# Patient Record
Sex: Female | Born: 1935
Health system: Southern US, Community
[De-identification: ages and names within clinical notes are randomized; demographics above are authoritative.]

## PROBLEM LIST (undated history)

## (undated) DIAGNOSIS — E039 Hypothyroidism, unspecified: Secondary | ICD-10-CM

## (undated) DIAGNOSIS — I10 Essential (primary) hypertension: Secondary | ICD-10-CM

## (undated) DIAGNOSIS — E785 Hyperlipidemia, unspecified: Secondary | ICD-10-CM

## (undated) HISTORY — DX: Essential (primary) hypertension: I10

## (undated) HISTORY — PX: APPENDECTOMY: SHX54

## (undated) HISTORY — PX: PACEMAKER INSERTION: SHX728

## (undated) HISTORY — PX: TOTAL HIP ARTHROPLASTY: SHX124

## (undated) HISTORY — PX: CATARACT EXTRACTION, BILATERAL: SHX1313

## (undated) HISTORY — DX: Hyperlipidemia, unspecified: E78.5

---

## 2000-12-14 HISTORY — PX: BREAST BIOPSY: SHX20

## 2001-06-10 ENCOUNTER — Other Ambulatory Visit: Admission: RE | Admit: 2001-06-10 | Discharge: 2001-06-10 | Payer: Self-pay | Admitting: Family Medicine

## 2001-06-13 LAB — HM PAP SMEAR: HM PAP: NORMAL

## 2001-10-19 LAB — HM COLONOSCOPY

## 2005-06-11 LAB — HM DEXA SCAN

## 2005-09-09 ENCOUNTER — Ambulatory Visit: Payer: Self-pay | Admitting: Family Medicine

## 2006-09-23 ENCOUNTER — Ambulatory Visit: Payer: Self-pay | Admitting: Family Medicine

## 2006-10-14 ENCOUNTER — Ambulatory Visit: Payer: Self-pay | Admitting: Family Medicine

## 2007-09-28 ENCOUNTER — Ambulatory Visit: Payer: Self-pay | Admitting: Family Medicine

## 2007-12-26 ENCOUNTER — Inpatient Hospital Stay (HOSPITAL_COMMUNITY): Admission: RE | Admit: 2007-12-26 | Discharge: 2007-12-29 | Payer: Self-pay | Admitting: Orthopedic Surgery

## 2007-12-29 ENCOUNTER — Encounter: Payer: Self-pay | Admitting: Internal Medicine

## 2008-01-24 ENCOUNTER — Encounter: Payer: Self-pay | Admitting: Orthopedic Surgery

## 2008-02-12 ENCOUNTER — Encounter: Payer: Self-pay | Admitting: Orthopedic Surgery

## 2008-08-27 ENCOUNTER — Inpatient Hospital Stay (HOSPITAL_COMMUNITY): Admission: RE | Admit: 2008-08-27 | Discharge: 2008-08-31 | Payer: Self-pay | Admitting: Orthopedic Surgery

## 2008-08-31 ENCOUNTER — Encounter: Payer: Self-pay | Admitting: Internal Medicine

## 2008-10-02 ENCOUNTER — Ambulatory Visit: Payer: Self-pay | Admitting: Family Medicine

## 2008-10-15 ENCOUNTER — Encounter: Payer: Self-pay | Admitting: Orthopedic Surgery

## 2008-11-13 ENCOUNTER — Encounter: Payer: Self-pay | Admitting: Orthopedic Surgery

## 2009-10-09 ENCOUNTER — Ambulatory Visit: Payer: Self-pay | Admitting: Family Medicine

## 2010-10-13 ENCOUNTER — Ambulatory Visit: Payer: Self-pay | Admitting: Family Medicine

## 2011-04-28 NOTE — Op Note (Signed)
NAME:  Savannah Tucker, Savannah Tucker NO.:  [id number]   MEDICAL RECORD NO.:  [id number]          PATIENT TYPE:  INP   LOCATION:  5024                         FACILITY:  [facility]   PHYSICIAN:  Mila Homer. Sherlean Foot, M.D. DATE OF BIRTH:  06-02-1930   DATE OF PROCEDURE:  08/27/2008  DATE OF DISCHARGE:                               OPERATIVE REPORT   SURGEON:  Mila Homer. Sherlean Foot, MD   ASSISTANT:  1. Altamese Cabal, PA-C.  2. Laural Benes. Su Hilt, PA-C.   INDICATIONS FOR PROCEDURE:  The patient failed conservative measures and  consented to right total hip replacement after successful left total hip  replacement previously.  Informed consent was obtained.   DESCRIPTION OF PROCEDURE:  The patient was laid supine, administered  spinal anesthesia, and placed in the left down lateral decubitus  position.  Sterile prep and drape was performed.  The patient was  converted to general anesthesia since the skin incision actually caused  pain.  I then used cautery to obtain hemostasis and to dissect down to  and through the fascia lata.  I then put the trial retractor in place.  I incised the anterior one-third of the gluteus medius, minimus,  lateralis in a single sleeve, dissected off the anterior femur.  I tied  with 3 stay sutures of #2 Tevdek.  I then put a Homan retractor  protecting the anterior medius, minimus, lateralis sleeve and did an  anterior hip capsulectomy.  I then used a neck cutting device to mark  out femoral neck cut and use the reciprocating saw to make that cut.  I  then placed Hohmann retractor anteriorly and posteriorly and removed the  labrum circumferentially.  I switched that side to the table with my PAs  and then sequentially reamed up to 52 mm and put in a no fiber mesh cup  with no holes and no spikes into her native anteversion of her socket.  I then went back to the back side of the table in flexed leg into a  sterile pouch anteriorly with the knee at 90 degrees.  I used  a canal  finders followed by a side-biting reamer, followed by sequential reamers  bring out a size 12.  We had good cortical chatter.  I broached to size  12, could not get the 12 seated all the way, but felt the leg length was  excellent.  I trialed with -3 head, felt this was very, very adequate  and very stable.  I removed trial components, tamped down the fully  porous-coated stem and tapped all -3 ball onto that.  Relocated the hip,  took it through an aggressive range of motion and I was very pleased  with that.  I then irrigated and put the stay sutures on the medius  lateralis sleeve from the gluteus lateralis sleeve through drill holes  in the trochanter, oversewn with #2 Tevdek sutures.  I then closed the  fascia lata with running #1 Vicryl sutures.  I then closed the deep soft  tissues with buried 0 Vicryls.  I closed the subcuticular layer with a  running  2-0 Vicryl.  I closed the skin with skin staples.  Dressed with  Mepilex dressing.   TOURNIQUET TIME:  None.   COMPLICATIONS:  None.   DRAINS:  None.   ESTIMATED BLOOD LOSS:  300 mL.           ______________________________  Mila Homer. Sherlean Foot, M.D.     SDL/MEDQ  D:  08/27/2008  T:  08/28/2008  Job:  [id number]

## 2011-04-28 NOTE — Discharge Summary (Signed)
NAME:  Savannah Tucker, Savannah Tucker NO.:  [id number]   MEDICAL RECORD NO.:  [id number]          PATIENT TYPE:  INP   LOCATION:  5024                         FACILITY:  [facility]   PHYSICIAN:  Savannah Tucker, M.D. DATE OF BIRTH:  [birth year]   DATE OF ADMISSION:  08/27/2008  DATE OF DISCHARGE:  08/31/2008                               DISCHARGE SUMMARY   ADMISSION DIAGNOSES:  1. End-stage osteoarthritis in the right hip.  2. History of atrial fibrillation.  3. Hypothyroidism.  4. Mitral tricuspid disease.   DISCHARGE DIAGNOSES:  1. Status post right total hip arthroscopy.  2. End-stage osteoarthritis in the right hip.  3. Hypotension.  4. Hypokalemia.  5. Acute blood loss anemia secondary to hernia.   HISTORY OF PRESENT ILLNESS:  Patient is a 77 year old female with  complaints of pain in her right hip for several years.  She has a past  medical history significant for a left total hip arthroplasty.  Patient  is getting worse, and the pain is starting to interfere with activities  of daily living.  Patient complains of increased pain with stairs.  She  has failed conservative treatment, including injections and NSAIDs and  is interested in hip replacement.  Risks and benefits of surgery were  discussed with the patient prior to surgery.   X-rays taken show severe arthritis in the right hip.   ALLERGIES:  Patient has no known drug allergies.   MEDICATIONS PRE-SURGERY:  1. Amoxicillin 500 mg p.r.n. before invasive procedures.  2. Advil p.r.n.  3. Lumigan 0.03% once daily.  4. Posture-D Calcium 600 mg twice daily.  5. Cardizem LA 180 mg once daily.  6. Furosemide 200 mg once daily p.r.n.  7. ABC Plus Senior Vitamins daily.  8. Metoprolol 50 mg once daily.  9. Levoxyl 25 mcg once daily.  10.Aspirin 81 mg daily.   SURGICAL PROCEDURE:  The patient was taken to the operating room on  August 27, 2008 by Dr. Georgena Spurling, associated by Skip Mayer, PA-  C and Altamese Cabal, PA-C.  The patient underwent anesthesia and Ancef 1  gm, and then underwent a total hip arthroscopy, right hip.  The patient  tolerated surgery well and was returned to the recovery room in good,  stable condition, where he had an AP and lateral x-ray of the right hip  done, which showed good placement of the parts inserted.   CONSULTS:  PT, OT, and case management were consulted postoperatively.   HOSPITAL COURSE:  On postoperative day #1, the patient was afebrile.  Blood pressure was low at 99/56, and H&H was 9.4 and 27.3.  The patient  was experiencing some postop nausea, which resolved.  Patient was given  nausea medication.   On postop day #2, patient was doing well in physical therapy.  Pain was  under control.  There were concerns about her blood pressure, which  dropped a little bit lower, to 90/61, so her blood pressure medications  were held at this time.  Sats were good, and patient was afebrile.  H&H  at that time was 8.9 and 25.9.  Also, her potassium was low at 3.1.  Patient was continued to get out of bed with physical therapy.  Chest x-  ray was ordered.  Patient was given KCL 20 mEq b.i.d. and a 500 cc bolus  of normal saline.  Patient was asymptomatic at bedtime.   On postop day #3, the patient continued to deny severe pain.  Denied  chest pain, shortness of breath.  At that time, she was still afebrile,  and her blood pressure was up but still low at 97/55.  Patient's  hypokalemia was resolved, and the patient was at 3.7 but was still given  10 mEq of KCL to sustain potassium.  At this point, her H&H was 7 and  20.4, so she was transfused with 2 units of PRBCs.  Patient was a little  pale but still was asymptomatic, denying dizziness and feelings of  weakness.  She did go to PT on day #3.   On postop day #4, patient's was still tolerating pain well.  She was  slightly febrile at 100.5.  She was given some Percocet with  acetaminophen in it.  Blood pressure  issues had resolved.  She was  132/87.  Her hypokalemia continued to be resolved at 3.9.  Her H&H today  was 9.9 and 28.3.  Patient was still asymptomatic, in good spirits this  morning, a little warm because of the fever.  Chest x-ray showed an  asymmetrical right apical opacity, which may represent benign scarring,  consolidation, or mass, and radiologist recommended followup with her  primary care physician.  This information was told to Ms. Wassel.  Ms.  Bashore acknowledged that she had a previous x-ray like that with her PCP  and would follow up.  Has been worked up for cancer in the past.  She  was stable.  The dressing had been changed on postop day #2, and there  was no drainage coming from the wound.   Patient is to be discharged to a skilled nursing facility, and someone  will come pick her up today and take her.  She has been approved for a  bed.   WOUND CARE:  Patient is to keep the wound clean and dry.  Dressing  changes daily.  She is to call us if any condescension.  She may shower  after two days if no draining.   She is weightbearing as tolerated.  Patient is to continue PT.   FOLLOW UP:  Patient needs to follow up with Dr. Sherlean Tucker at [phone].  Patient needs to call for this appointment.  She should make an  appointment for September 11, 2008.  Patient will be discharged on  Percocet 5/325 1-2 tablets q.4-6h. as needed for pain, Robaxin 500 mg 1-  2 tabs every 6 hours as needed for spasms, and Lovenox 40 mg inject 1  subcu daily, last dose given on September 10, 2008.   Patient should follow up with her primary care doctor for abnormal chest  x-ray.   CONDITION ON DISCHARGE:  Patient was discharged to skilled nursing  facility in good, stable condition.     ______________________________  Altamese Cabal, PA-C    ______________________________  Savannah Tucker, M.D.    MJ/MEDQ  D:  08/31/2008  T:  08/31/2008  Job:  [id number]

## 2011-04-28 NOTE — Discharge Summary (Signed)
NAME:  LINENSTeale, Goodgame             ACCOUNT NO.:  [id number]   MEDICAL RECORD NO.:  [id number]          PATIENT TYPE:  INP   LOCATION:  5012                         FACILITY:  [facility]   PHYSICIAN:  Mila Homer. Sherlean Foot, M.D. DATE OF BIRTH:  [birth year]   DATE OF ADMISSION:  12/26/2007  DATE OF DISCHARGE:  12/29/2007                               DISCHARGE SUMMARY   ADMISSION DIAGNOSES:  1. End-stage osteoarthritis, bilateral hips, left worse than right.  2. Chronic atrial fibrillation, rate controlled.  3. Hypothyroidism.  4. Mitral/tricuspid valve disease.   DISCHARGE DIAGNOSES:  1. End-stage osteoarthritis, bilateral hips, status post left total      hip arthroplasty.  2. Acute blood loss anemia secondary to surgery.  3. Possible preoperative urinary tract infection.  4. Constipation.  5. Hematoma, left side.  6. Mild hypokalemia, now resolved.  7. Chronic atrial fibrillation, rate controlled.  8. Hypothyroidism.  9. Mitral and tricuspid valve disease.   SURGICAL PROCEDURES:  On December 26, 2007, Ms. Mccalip underwent a left  total hip arthroplasty by Dr. Mila Homer. Lucey assisted by Legrand Pitts.  Duffy, PA-C.  She had a longevity cross-link poly 32-mm inner diameter,  50-54-mm outer diameter shell placed with a Trilogy acetabular shell  porous without holes 54-mm outer diameter.  A Versus femoral head 12/14  taper and 32-mm diameter, -3.5-mm link lengths with a Versus femoral  stem beaded full-coat collar 12/14 neck taper, standard neck offset,  size 12.   COMPLICATIONS:  None.   CONSULTS:  1. Physical Therapy and Progression nurse consult on December 28, 2007.  2. Occupational Therapy consult on December 28, 2007.   HISTORY OF PRESENT ILLNESS:  This 77 year old white female patient  presented to Dr. Sherlean Foot with history of bilateral hip pain, left worse  than right, for the last at 6 to 7 years.  Pain in the left hip started  suddenly.  It has been getting progressively worse with no  known injury.  It is an intermittent, dull ache to sharp sensation over the left  buttock and trochanter with occasional radiation into the groin.  Pain  increases with stairs and decreases with rest.  The knee does give way.  She can sleep on the left side with the leg straight, and she has  difficulty putting on her socks and shoes.  She has failed conservative  treatment, and because of this, she is presenting for a left hip  replacement.   HOSPITAL COURSE:  Ms. Lemoine has tolerated her surgical procedure well  without immediate postoperative complications.  She was transferred to  5000.  On postop day #1, T-max was 98, BP 98/66, pulse 88, hemoglobin  90, hematocrit 26.1.  She was in A fib but was asymptomatic.  It was  felt with her heart disease, however, she would benefit from a  transfusion, and she was transfused with 2 units of packed red blood  cells.  She did have some nausea.  Her meds were adjusted, and she was  started on medicine for nausea which did help.  She was started on  therapy per protocol.  On postop day #2, she was feeling better.  T-max 99, vitals stable.  Hemoglobin 11.5, hematocrit 33.5.  She did have some mild edema on the  left side.  Dressing was intact.  The leg was neurovascularly intact.  She had some difficulty with constipation.  She was switched to p.o.  pain meds, a K pad applied to the left side,  Celebrex for pain and  started on potassium twice a day due to a low potassium at 3.4.   On postop day #3, she is doing well.  Hemoglobin and hematocrit are  stable.  Potassium was still 3.4.  The leg is neurovascularly intact.  The incision is well approximated with staples and without drainage.  She was doing well enough with therapy.  Is felt she is ready for a  transfer to the skilled facility and will be transferred there later  today.   DISCHARGE INSTRUCTIONS:   DIET:  She is to resume her regular diet.   MEDICATIONS:  1. Lovenox 40 mg  subcu every morning with the last dose to be on      January 09, 2008.  2. Colace 100 mg p.o. b.i.d.  3. Senokot 1 tablet p.o. b.i.d. with meals.  4. Cardizem CD ER 180 mg p.o. every morning.  5. Synthroid 25 mcg one tablet p.o. every morning.  6. Toprol XL 50 mg p.o. every morning.  7. Celebrex 200 mg p.o. every morning.  She is to continue this just      for 2 weeks postop.  8. K-Dur 20 mEq p.o. b.i.d. for 5 days.  9. Laxative of choice p.r.n. constipation.  10.Percocet 1-2 tablets p.o. q.4 h. p.r.n. for pain.  11.Tylenol 1-2 tablets p.o. q.4 h. p.r.n. temperature greater than      101.5.  12.Robaxin 500 mg one to two tablets p.o. q.6 h. p.r.n. for spasms.  13.Reglan 10 mg p.o. q.8 h. p.r.n. for nausea.   ACTIVITY:  She can be out of bed weightbearing as tolerated on the left  leg with use of the walker.  She is to have PT and OT per rehab  protocol.   WOUND CARE:  She can have a heating pad to that left side to help with  the hematoma.  Please keep the dressing clean and dry, and it may be  changed to just a dry gauze dressings next Tuesday or Wednesday.  Please  notify Dr. Sherlean Foot if temperature greater than 101.5, chills, pain  unrelieved by pain meds or foul-smelling drainage from the wound.  The  dressing may be changed earlier if it becomes soiled.   FOLLOW-UP:  She needs to follow up with Dr. Sherlean Foot in our office on  Tuesday January 10, 2008 and needs to call [phone] for that  appointment.   LABORATORY DATA:  Chest x-ray done on December 21, 2006 showed changes of  COPD with no acute cardiopulmonary disease noted.  X-ray taken of the  left hip on December 26, 2007 showed the left total hip arthroplasty  without immediate complicating feature and severe right hip  osteoarthritis.   Hemoglobin and hematocrit have ranged from 13.1 and 38.4 on December 22, 2007 and 26.1 on the 13th, to 11.4 and 33.1 on the 15th.  Platelets were  333 on the 8th and are 188 on the 15th.   UA on  January 8 showed trace protein, 0-2 read and white cells but many  bacteria which the urine culture did grow out greater than 100,000  colonies per mL of E-coli which was sensitive to cephazolin.  She was  maintained on Ancef for an extra period of time postoperatively to treat  that.   Sodium went from of 136 on the 13th to 137 on the 15th.  Potassium went  from 3.9 on the 13th to a low of 3.4 on the 14th and 15th.  All other  laboratory studies were within normal limits.      Legrand Pitts Duffy, P.A.    ______________________________  Mila Homer. Sherlean Foot, M.D.    KED/MEDQ  D:  12/29/2007  T:  12/29/2007  Job:  [id number]

## 2011-04-28 NOTE — Op Note (Signed)
NAME:  Savannah Tucker, Savannah Tucker NO.:  [id number]   MEDICAL RECORD NO.:  [id number]          PATIENT TYPE:  INP   LOCATION:  2899                         FACILITY:  [facility]   PHYSICIAN:  Mila Homer. Sherlean Foot, M.D. DATE OF BIRTH:  07/12/1930   DATE OF PROCEDURE:  12/26/2007  DATE OF DISCHARGE:                               OPERATIVE REPORT   SURGEON:  Mila Homer. Sherlean Foot, M.D.   ASSISTANT:  Legrand Pitts. Duffy, P.A.   ANESTHESIA:  General.   PREOPERATIVE DIAGNOSIS:  Left hip osteoarthritis.   POSTOPERATIVE DIAGNOSIS:  Left hip osteoarthritis.   PROCEDURE:  Left total hip arthroplasty.   INDICATIONS FOR PROCEDURE:  The patient is a 77 year old white female  with failure of conservative measures for osteoarthritis of the left  hip.  Informed consent was obtained.   DESCRIPTION OF PROCEDURE:  The patient was laid supine and administered  general anesthesia.  She was then placed on the right hip in the left  lateral decubitus position.  A curvilinear incision was made after  sterile prep and drape.  A #10 blade was used to make that, and it was  carried down through the IC band, and  the IC T-band was incised with  the cautery.  Hemostasis was obtained.  __________ retractor was then  put in place, and an anterior hip capsulectomy was performed with  cautery and forceps.  I then used the reciprocating saw to cut the neck;  remove the head and neck segment.  I placed Homer retractors anteriorly  and posteriorly and then removed the labrum.  I then sequentially reamed  up to 50 mm and placed a 52-mm fiber mesh with no holes and no spiked  cup.  I then externally flexed the hip and knee; put the leg in a  sterile pouch off the anterior aspect of the table.  I gained access to  the femoral canal with a canal finder.  I then reamed up to 12 mm with  the distal reamers, and then broached to 12 and trialed with various  neck sizes and head sizes; I felt that a -3.5 on a standard neck was  most stable.  I then removed the trial components and placed the real.  fully porous-coated size 12 stem, and tamped a -3.5 x 32 mm head onto  the Morris taper, as well as a standard polyethylene liner on the  acetabulum.  This afforded excellent stability.  I then irrigated and  closed with #2 Ethibond sutures through drill holes in the vastus  medialis lateralis and medius and minimus sleeve.  I oversewed with  figure-of-eight #1 Ethibond sutures.  I then ran a #1 running Vicryl in  the T-band.  I closed the deep soft tissues with buried Vicryl, and ran  a subcuticular 2-0 Vicryl stitch and skin staples.  Dressed with  __________ dressing.   COMPLICATIONS:  None.   DRAINS:  None.   ESTIMATED BLOOD LOSS:  300 mL.           ______________________________  Mila Homer. Sherlean Foot, M.D.     SDL/MEDQ  D:  12/26/2007  T:  12/26/2007  Job:  [id number]

## 2011-09-03 LAB — TYPE AND SCREEN

## 2011-09-03 LAB — CBC
HCT: 38.4
Hemoglobin: 9 — ABNORMAL LOW
Hemoglobin: 11.4 — ABNORMAL LOW
MCHC: 34.4
MCHC: 34.5
MCV: 102 — ABNORMAL HIGH
Platelets: 224
Platelets: 333
RBC: 3.43 — ABNORMAL LOW
RBC: 3.76 — ABNORMAL LOW
RBC: 3.39 — ABNORMAL LOW
RDW: 13
RDW: 16.4 — ABNORMAL HIGH
WBC: 5.7

## 2011-09-03 LAB — COMPREHENSIVE METABOLIC PANEL
ALT: 32
AST: 37
Albumin: 3.8
Alkaline Phosphatase: 59
CO2: 24
Chloride: 104
GFR calc non Af Amer: >60
Potassium: 4
Sodium: 139
Total Bilirubin: 0.5

## 2011-09-03 LAB — BASIC METABOLIC PANEL
BUN: 6
BUN: 6
Calcium: 8.4
Creatinine, Ser: 0.78
GFR calc non Af Amer: >60
GFR calc non Af Amer: >60
GFR calc non Af Amer: >60
Glucose, Bld: 122 — ABNORMAL HIGH
Glucose, Bld: 97
Potassium: 3.9
Potassium: 3.4 — ABNORMAL LOW
Sodium: 138
Sodium: 137

## 2011-09-03 LAB — DIFFERENTIAL
Eosinophils Absolute: 0.1
Eosinophils Relative: 2
Lymphocytes Relative: 33
Lymphs Abs: 1.9
Monocytes Relative: 8
Neutrophils Relative %: 57

## 2011-09-03 LAB — URINE MICROSCOPIC-ADD ON

## 2011-09-03 LAB — URINALYSIS, ROUTINE W REFLEX MICROSCOPIC
Leukocytes, UA: NEGATIVE
Protein, ur: NEGATIVE
Specific Gravity, Urine: 1.014
Urobilinogen, UA: 0.2

## 2011-09-03 LAB — URINE CULTURE

## 2011-09-03 LAB — ABO/RH: ABO/RH(D): B POS

## 2011-09-03 LAB — PROTIME-INR
INR: 0.9
Prothrombin Time: 12.7

## 2011-09-14 LAB — CBC
HCT: 20.4 — ABNORMAL LOW
HCT: 25.9 — ABNORMAL LOW
HCT: 27.3 — ABNORMAL LOW
Hemoglobin: 7 — CL
Hemoglobin: 9.4 — ABNORMAL LOW
Hemoglobin: 9.9 — ABNORMAL LOW
MCHC: 34.6
MCHC: 35.1
MCV: 98.5
Platelets: 213
RBC: 2.88 — ABNORMAL LOW
RDW: 12.9
RDW: 13
RDW: 13.1

## 2011-09-14 LAB — CROSSMATCH

## 2011-09-14 LAB — BASIC METABOLIC PANEL
BUN: 2 — ABNORMAL LOW
BUN: 3 — ABNORMAL LOW
CO2: 26
CO2: 29
Chloride: 102
Chloride: 104
Chloride: 105
Creatinine, Ser: 0.85
GFR calc Af Amer: >60
GFR calc non Af Amer: >60
GFR calc non Af Amer: >60
Glucose, Bld: 140 — ABNORMAL HIGH
Glucose, Bld: 94
Potassium: 3.7
Potassium: 3.9
Potassium: 5.1
Sodium: 137
Sodium: 139

## 2011-09-16 LAB — COMPREHENSIVE METABOLIC PANEL
AST: 27
Albumin: 4.4
BUN: 8
CO2: 24
Calcium: 9.9
Creatinine, Ser: 0.83
GFR calc Af Amer: >60
GFR calc non Af Amer: >60

## 2011-09-16 LAB — CROSSMATCH
ABO/RH(D): B POS
Antibody Screen: NEGATIVE

## 2011-09-16 LAB — URINE CULTURE

## 2011-09-16 LAB — APTT: aPTT: 24

## 2011-09-16 LAB — DIFFERENTIAL
Basophils Absolute: 0
Eosinophils Relative: 1
Lymphocytes Relative: 29
Lymphs Abs: 2.1
Neutro Abs: 4.6

## 2011-09-16 LAB — CBC
HCT: 40.2
MCHC: 33.8
MCV: 103.5 — ABNORMAL HIGH
Platelets: 320

## 2011-09-16 LAB — URINALYSIS, ROUTINE W REFLEX MICROSCOPIC
Glucose, UA: NEGATIVE
Ketones, ur: NEGATIVE
Nitrite: NEGATIVE
Protein, ur: NEGATIVE
Urobilinogen, UA: 0.2

## 2011-09-16 LAB — PROTIME-INR: Prothrombin Time: 13.1

## 2011-11-13 ENCOUNTER — Ambulatory Visit: Payer: Self-pay | Admitting: Family Medicine

## 2011-12-24 DIAGNOSIS — H4010X0 Unspecified open-angle glaucoma, stage unspecified: Secondary | ICD-10-CM | POA: Diagnosis not present

## 2012-01-13 DIAGNOSIS — Z0000 Encounter for general adult medical examination without abnormal findings: Secondary | ICD-10-CM | POA: Diagnosis not present

## 2012-01-13 DIAGNOSIS — M8610 Other acute osteomyelitis, unspecified site: Secondary | ICD-10-CM | POA: Diagnosis not present

## 2012-01-13 DIAGNOSIS — Z1331 Encounter for screening for depression: Secondary | ICD-10-CM | POA: Diagnosis not present

## 2012-01-13 DIAGNOSIS — I10 Essential (primary) hypertension: Secondary | ICD-10-CM | POA: Diagnosis not present

## 2012-01-13 DIAGNOSIS — Z1339 Encounter for screening examination for other mental health and behavioral disorders: Secondary | ICD-10-CM | POA: Diagnosis not present

## 2012-01-25 DIAGNOSIS — H4010X0 Unspecified open-angle glaucoma, stage unspecified: Secondary | ICD-10-CM | POA: Diagnosis not present

## 2012-06-09 DIAGNOSIS — I059 Rheumatic mitral valve disease, unspecified: Secondary | ICD-10-CM | POA: Diagnosis not present

## 2012-06-09 DIAGNOSIS — I4891 Unspecified atrial fibrillation: Secondary | ICD-10-CM | POA: Diagnosis not present

## 2012-07-13 DIAGNOSIS — R5381 Other malaise: Secondary | ICD-10-CM | POA: Diagnosis not present

## 2012-07-13 DIAGNOSIS — E039 Hypothyroidism, unspecified: Secondary | ICD-10-CM | POA: Diagnosis not present

## 2012-07-13 DIAGNOSIS — L659 Nonscarring hair loss, unspecified: Secondary | ICD-10-CM | POA: Diagnosis not present

## 2012-07-13 DIAGNOSIS — E785 Hyperlipidemia, unspecified: Secondary | ICD-10-CM | POA: Diagnosis not present

## 2012-07-13 DIAGNOSIS — I10 Essential (primary) hypertension: Secondary | ICD-10-CM | POA: Diagnosis not present

## 2012-07-13 DIAGNOSIS — R5383 Other fatigue: Secondary | ICD-10-CM | POA: Diagnosis not present

## 2012-07-13 DIAGNOSIS — E7800 Pure hypercholesterolemia, unspecified: Secondary | ICD-10-CM | POA: Diagnosis not present

## 2012-08-26 DIAGNOSIS — H4010X0 Unspecified open-angle glaucoma, stage unspecified: Secondary | ICD-10-CM | POA: Diagnosis not present

## 2012-10-18 DIAGNOSIS — Z23 Encounter for immunization: Secondary | ICD-10-CM | POA: Diagnosis not present

## 2012-11-16 DIAGNOSIS — I4891 Unspecified atrial fibrillation: Secondary | ICD-10-CM | POA: Diagnosis not present

## 2012-11-16 DIAGNOSIS — I059 Rheumatic mitral valve disease, unspecified: Secondary | ICD-10-CM | POA: Diagnosis not present

## 2012-11-16 DIAGNOSIS — I495 Sick sinus syndrome: Secondary | ICD-10-CM | POA: Diagnosis not present

## 2012-11-24 ENCOUNTER — Ambulatory Visit: Payer: Self-pay | Admitting: Family Medicine

## 2012-11-24 DIAGNOSIS — Z1231 Encounter for screening mammogram for malignant neoplasm of breast: Secondary | ICD-10-CM | POA: Diagnosis not present

## 2012-12-28 DIAGNOSIS — I10 Essential (primary) hypertension: Secondary | ICD-10-CM | POA: Diagnosis not present

## 2012-12-28 DIAGNOSIS — L659 Nonscarring hair loss, unspecified: Secondary | ICD-10-CM | POA: Diagnosis not present

## 2012-12-28 DIAGNOSIS — I4891 Unspecified atrial fibrillation: Secondary | ICD-10-CM | POA: Diagnosis not present

## 2012-12-28 DIAGNOSIS — E039 Hypothyroidism, unspecified: Secondary | ICD-10-CM | POA: Diagnosis not present

## 2013-05-17 DIAGNOSIS — I059 Rheumatic mitral valve disease, unspecified: Secondary | ICD-10-CM | POA: Diagnosis not present

## 2013-05-17 DIAGNOSIS — I10 Essential (primary) hypertension: Secondary | ICD-10-CM | POA: Diagnosis not present

## 2013-05-17 DIAGNOSIS — I4891 Unspecified atrial fibrillation: Secondary | ICD-10-CM | POA: Diagnosis not present

## 2013-05-31 DIAGNOSIS — E039 Hypothyroidism, unspecified: Secondary | ICD-10-CM | POA: Diagnosis not present

## 2013-05-31 DIAGNOSIS — L659 Nonscarring hair loss, unspecified: Secondary | ICD-10-CM | POA: Diagnosis not present

## 2013-05-31 DIAGNOSIS — Z1339 Encounter for screening examination for other mental health and behavioral disorders: Secondary | ICD-10-CM | POA: Diagnosis not present

## 2013-05-31 DIAGNOSIS — Z0000 Encounter for general adult medical examination without abnormal findings: Secondary | ICD-10-CM | POA: Diagnosis not present

## 2013-05-31 DIAGNOSIS — I4891 Unspecified atrial fibrillation: Secondary | ICD-10-CM | POA: Diagnosis not present

## 2013-05-31 DIAGNOSIS — Z1331 Encounter for screening for depression: Secondary | ICD-10-CM | POA: Diagnosis not present

## 2013-07-19 DIAGNOSIS — M949 Disorder of cartilage, unspecified: Secondary | ICD-10-CM | POA: Diagnosis not present

## 2013-07-19 DIAGNOSIS — M899 Disorder of bone, unspecified: Secondary | ICD-10-CM | POA: Diagnosis not present

## 2013-07-19 DIAGNOSIS — E2839 Other primary ovarian failure: Secondary | ICD-10-CM | POA: Diagnosis not present

## 2013-07-19 DIAGNOSIS — N951 Menopausal and female climacteric states: Secondary | ICD-10-CM | POA: Diagnosis not present

## 2013-07-25 DIAGNOSIS — H903 Sensorineural hearing loss, bilateral: Secondary | ICD-10-CM | POA: Diagnosis not present

## 2013-08-28 DIAGNOSIS — H4010X0 Unspecified open-angle glaucoma, stage unspecified: Secondary | ICD-10-CM | POA: Diagnosis not present

## 2013-09-18 DIAGNOSIS — R0602 Shortness of breath: Secondary | ICD-10-CM | POA: Diagnosis not present

## 2013-09-18 DIAGNOSIS — I517 Cardiomegaly: Secondary | ICD-10-CM | POA: Diagnosis not present

## 2013-09-18 DIAGNOSIS — E782 Mixed hyperlipidemia: Secondary | ICD-10-CM | POA: Diagnosis not present

## 2013-09-18 DIAGNOSIS — I4891 Unspecified atrial fibrillation: Secondary | ICD-10-CM | POA: Diagnosis not present

## 2013-09-26 DIAGNOSIS — I4891 Unspecified atrial fibrillation: Secondary | ICD-10-CM | POA: Diagnosis not present

## 2013-09-27 DIAGNOSIS — R0602 Shortness of breath: Secondary | ICD-10-CM | POA: Diagnosis not present

## 2013-10-10 DIAGNOSIS — Z23 Encounter for immunization: Secondary | ICD-10-CM | POA: Diagnosis not present

## 2013-10-18 DIAGNOSIS — I495 Sick sinus syndrome: Secondary | ICD-10-CM | POA: Diagnosis not present

## 2013-10-18 DIAGNOSIS — I517 Cardiomegaly: Secondary | ICD-10-CM | POA: Diagnosis not present

## 2013-10-18 DIAGNOSIS — I059 Rheumatic mitral valve disease, unspecified: Secondary | ICD-10-CM | POA: Diagnosis not present

## 2013-10-18 DIAGNOSIS — E782 Mixed hyperlipidemia: Secondary | ICD-10-CM | POA: Diagnosis not present

## 2013-11-16 DIAGNOSIS — I495 Sick sinus syndrome: Secondary | ICD-10-CM | POA: Diagnosis not present

## 2013-11-16 DIAGNOSIS — I059 Rheumatic mitral valve disease, unspecified: Secondary | ICD-10-CM | POA: Diagnosis not present

## 2013-11-16 DIAGNOSIS — I4891 Unspecified atrial fibrillation: Secondary | ICD-10-CM | POA: Diagnosis not present

## 2013-11-20 ENCOUNTER — Ambulatory Visit: Payer: Self-pay | Admitting: Cardiology

## 2013-11-20 DIAGNOSIS — Z01810 Encounter for preprocedural cardiovascular examination: Secondary | ICD-10-CM | POA: Diagnosis not present

## 2013-11-20 DIAGNOSIS — I059 Rheumatic mitral valve disease, unspecified: Secondary | ICD-10-CM | POA: Diagnosis not present

## 2013-11-20 DIAGNOSIS — I10 Essential (primary) hypertension: Secondary | ICD-10-CM | POA: Diagnosis not present

## 2013-11-20 DIAGNOSIS — I495 Sick sinus syndrome: Secondary | ICD-10-CM | POA: Diagnosis not present

## 2013-11-20 DIAGNOSIS — Z01812 Encounter for preprocedural laboratory examination: Secondary | ICD-10-CM | POA: Diagnosis not present

## 2013-11-20 DIAGNOSIS — I4891 Unspecified atrial fibrillation: Secondary | ICD-10-CM | POA: Diagnosis not present

## 2013-11-20 DIAGNOSIS — Z01818 Encounter for other preprocedural examination: Secondary | ICD-10-CM | POA: Diagnosis not present

## 2013-11-20 LAB — CBC WITH DIFFERENTIAL/PLATELET
Basophil %: 1.1 %
Eosinophil %: 1.5 %
Lymphocyte #: 1.2 10*3/uL (ref 1.0–3.6)
Lymphocyte %: 21 %
MCHC: 33.9 g/dL (ref 32.0–36.0)
MCV: 101 fL — ABNORMAL HIGH (ref 80–100)
Monocyte #: 0.4 x10 3/mm (ref 0.2–0.9)
Monocyte %: 7.1 %
Platelet: 269 10*3/uL (ref 150–440)
RBC: 3.88 10*6/uL (ref 3.80–5.20)

## 2013-11-20 LAB — BASIC METABOLIC PANEL
Calcium, Total: 9.8 mg/dL (ref 8.5–10.1)
Chloride: 103 mmol/L (ref 98–107)
Co2: 29 mmol/L (ref 21–32)
EGFR (African American): >60
EGFR (Non-African Amer.): >60
Glucose: 100 mg/dL — ABNORMAL HIGH (ref 65–99)
Osmolality: 271 (ref 275–301)
Sodium: 136 mmol/L (ref 136–145)

## 2013-11-20 LAB — PROTIME-INR
INR: 1
Prothrombin Time: 12.9 secs (ref 11.5–14.7)

## 2013-11-21 ENCOUNTER — Ambulatory Visit: Payer: Self-pay | Admitting: Cardiology

## 2013-11-21 DIAGNOSIS — I10 Essential (primary) hypertension: Secondary | ICD-10-CM | POA: Diagnosis not present

## 2013-11-21 DIAGNOSIS — Z79899 Other long term (current) drug therapy: Secondary | ICD-10-CM | POA: Diagnosis not present

## 2013-11-21 DIAGNOSIS — I495 Sick sinus syndrome: Secondary | ICD-10-CM | POA: Diagnosis not present

## 2013-11-21 DIAGNOSIS — Z7982 Long term (current) use of aspirin: Secondary | ICD-10-CM | POA: Diagnosis not present

## 2013-11-21 DIAGNOSIS — H409 Unspecified glaucoma: Secondary | ICD-10-CM | POA: Diagnosis not present

## 2013-11-21 DIAGNOSIS — I4891 Unspecified atrial fibrillation: Secondary | ICD-10-CM | POA: Diagnosis not present

## 2013-11-21 DIAGNOSIS — E785 Hyperlipidemia, unspecified: Secondary | ICD-10-CM | POA: Diagnosis not present

## 2013-11-21 DIAGNOSIS — E039 Hypothyroidism, unspecified: Secondary | ICD-10-CM | POA: Diagnosis not present

## 2013-11-21 DIAGNOSIS — J9819 Other pulmonary collapse: Secondary | ICD-10-CM | POA: Diagnosis not present

## 2013-11-21 DIAGNOSIS — Z95818 Presence of other cardiac implants and grafts: Secondary | ICD-10-CM | POA: Diagnosis not present

## 2013-11-21 DIAGNOSIS — Z87891 Personal history of nicotine dependence: Secondary | ICD-10-CM | POA: Diagnosis not present

## 2013-11-22 DIAGNOSIS — Z7982 Long term (current) use of aspirin: Secondary | ICD-10-CM | POA: Diagnosis not present

## 2013-11-22 DIAGNOSIS — E785 Hyperlipidemia, unspecified: Secondary | ICD-10-CM | POA: Diagnosis not present

## 2013-11-22 DIAGNOSIS — I4891 Unspecified atrial fibrillation: Secondary | ICD-10-CM | POA: Diagnosis not present

## 2013-11-22 DIAGNOSIS — I10 Essential (primary) hypertension: Secondary | ICD-10-CM | POA: Diagnosis not present

## 2013-11-22 DIAGNOSIS — Z79899 Other long term (current) drug therapy: Secondary | ICD-10-CM | POA: Diagnosis not present

## 2013-11-22 DIAGNOSIS — I495 Sick sinus syndrome: Secondary | ICD-10-CM | POA: Diagnosis not present

## 2013-11-22 LAB — TROPONIN I: Troponin-I: 0.1 ng/mL — ABNORMAL HIGH

## 2013-11-27 DIAGNOSIS — I119 Hypertensive heart disease without heart failure: Secondary | ICD-10-CM | POA: Diagnosis not present

## 2013-11-27 DIAGNOSIS — I369 Nonrheumatic tricuspid valve disorder, unspecified: Secondary | ICD-10-CM | POA: Diagnosis not present

## 2013-11-27 DIAGNOSIS — I4891 Unspecified atrial fibrillation: Secondary | ICD-10-CM | POA: Diagnosis not present

## 2013-11-27 DIAGNOSIS — R0789 Other chest pain: Secondary | ICD-10-CM | POA: Diagnosis not present

## 2013-11-28 DIAGNOSIS — E7800 Pure hypercholesterolemia, unspecified: Secondary | ICD-10-CM | POA: Diagnosis not present

## 2013-11-28 DIAGNOSIS — E039 Hypothyroidism, unspecified: Secondary | ICD-10-CM | POA: Diagnosis not present

## 2013-11-28 DIAGNOSIS — E785 Hyperlipidemia, unspecified: Secondary | ICD-10-CM | POA: Diagnosis not present

## 2013-11-28 DIAGNOSIS — R5381 Other malaise: Secondary | ICD-10-CM | POA: Diagnosis not present

## 2013-11-28 DIAGNOSIS — L659 Nonscarring hair loss, unspecified: Secondary | ICD-10-CM | POA: Diagnosis not present

## 2013-11-28 LAB — LIPID PANEL
Cholesterol: 192 mg/dL (ref 0–200)
HDL: 57 mg/dL (ref 35–70)
LDL Cholesterol: 112 mg/dL
LDL/HDL RATIO: 2
TRIGLYCERIDES: 116 mg/dL (ref 40–160)

## 2014-01-08 ENCOUNTER — Ambulatory Visit: Payer: Self-pay | Admitting: Family Medicine

## 2014-01-08 DIAGNOSIS — Z1231 Encounter for screening mammogram for malignant neoplasm of breast: Secondary | ICD-10-CM | POA: Diagnosis not present

## 2014-01-09 DIAGNOSIS — R0602 Shortness of breath: Secondary | ICD-10-CM | POA: Diagnosis not present

## 2014-01-09 DIAGNOSIS — I495 Sick sinus syndrome: Secondary | ICD-10-CM | POA: Diagnosis not present

## 2014-01-09 DIAGNOSIS — I4891 Unspecified atrial fibrillation: Secondary | ICD-10-CM | POA: Diagnosis not present

## 2014-02-26 DIAGNOSIS — H4010X0 Unspecified open-angle glaucoma, stage unspecified: Secondary | ICD-10-CM | POA: Diagnosis not present

## 2014-06-19 DIAGNOSIS — I495 Sick sinus syndrome: Secondary | ICD-10-CM | POA: Diagnosis not present

## 2014-08-07 IMAGING — CR DG CHEST 2V
1 series · 2 of 2 positions shown · non-contrast
Comparison: None.

CLINICAL DATA: Hypertension.  Preop pacemaker

EXAM:
CHEST  2 VIEW

[Series 1: w chest pa · 0.14mm/px · 2 of 2 slices shown]
[im 1/2]
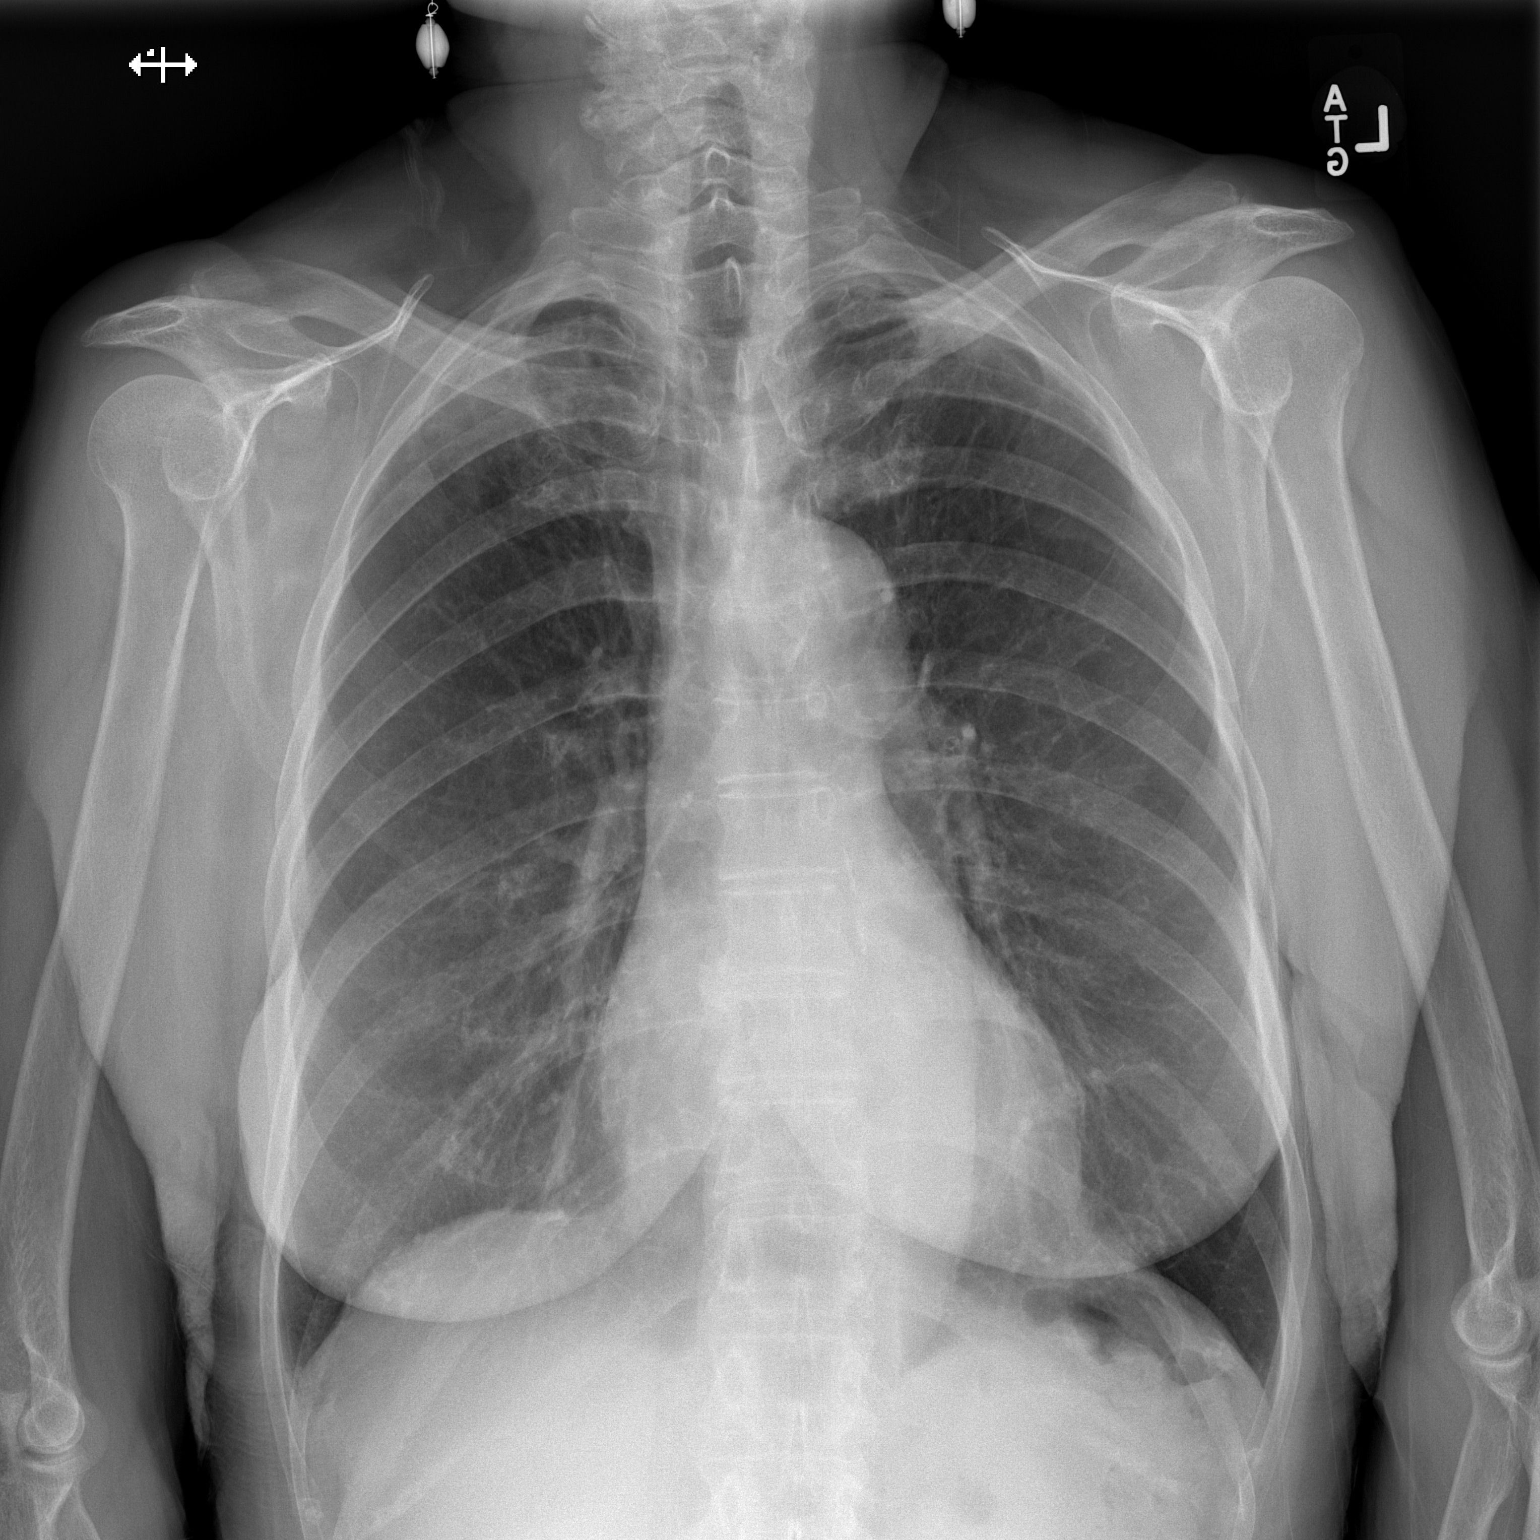
[im 2/2]
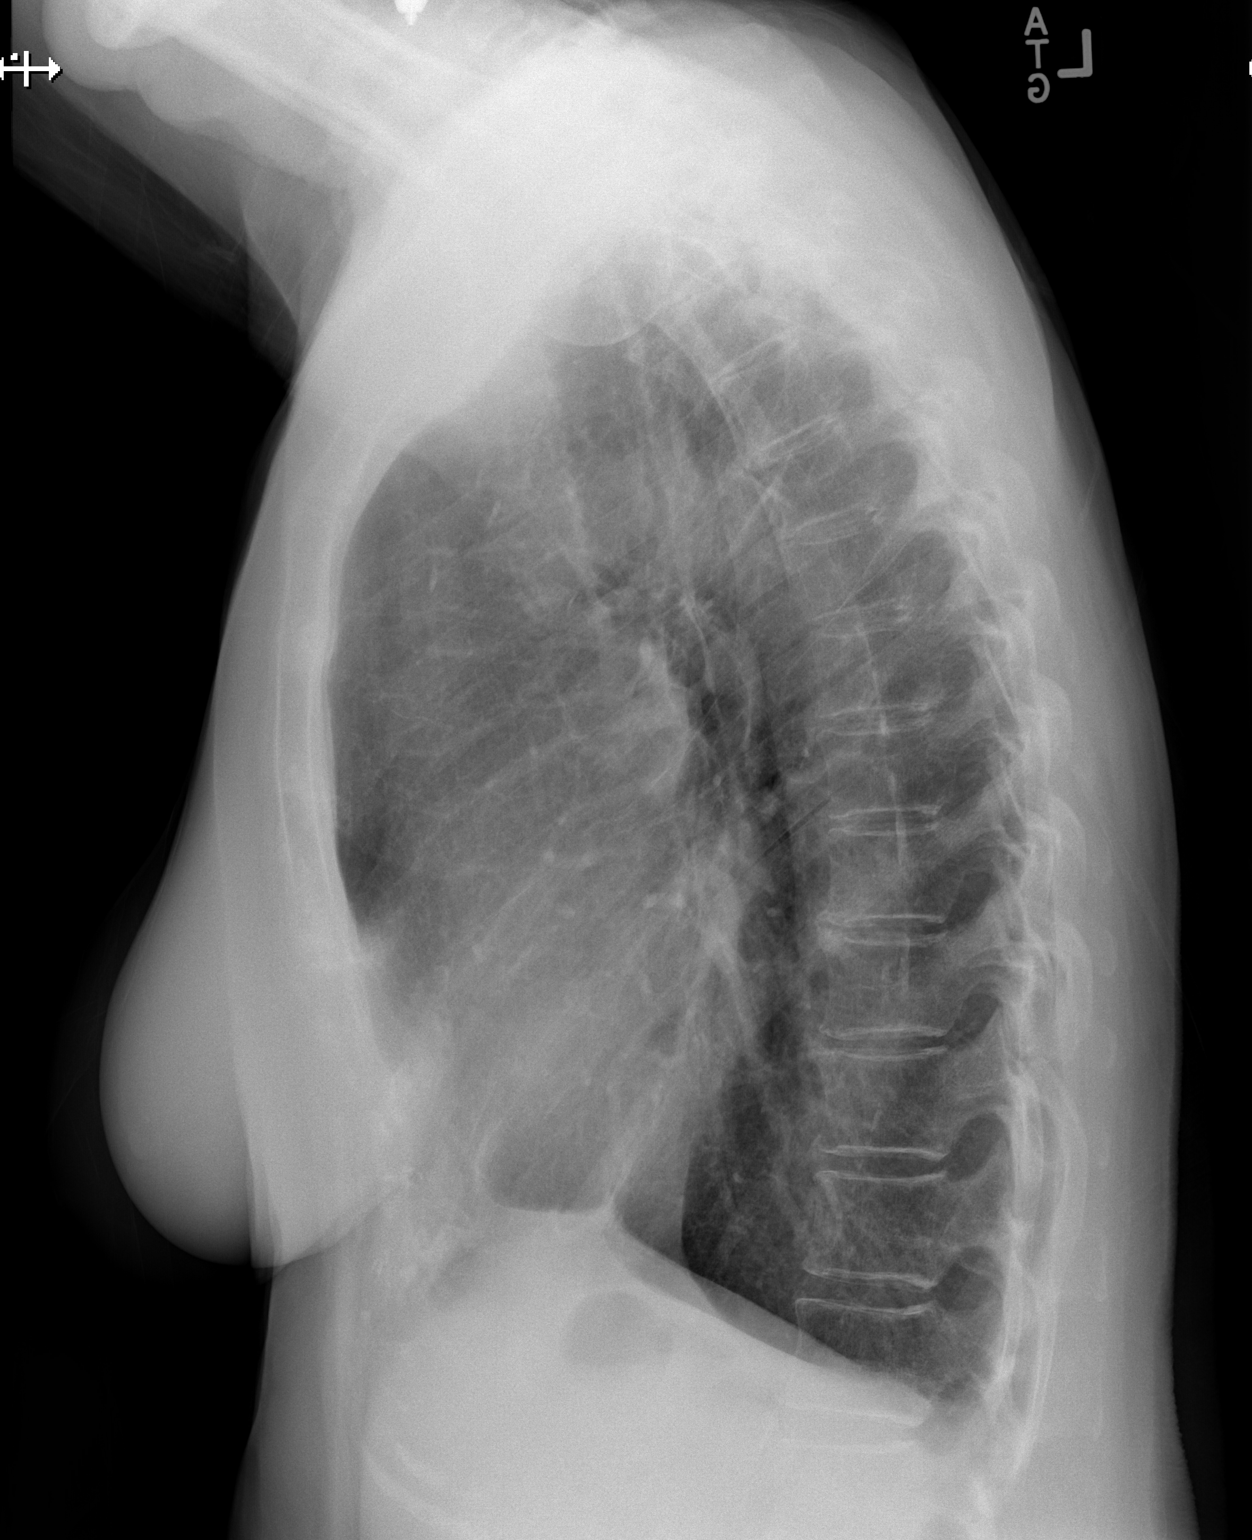

[2 of 2 positions shown; findings below may reference images not displayed]

FINDINGS: Pulmonary hyperinflation suggesting COPD. Heart size is upper
normal. Negative for heart failure. Negative for infiltrate mass or
effusion. Mild apical scarring.
IMPRESSION: No active cardiopulmonary disease.

## 2014-08-27 DIAGNOSIS — H40039 Anatomical narrow angle, unspecified eye: Secondary | ICD-10-CM | POA: Diagnosis not present

## 2014-10-03 DIAGNOSIS — I10 Essential (primary) hypertension: Secondary | ICD-10-CM | POA: Diagnosis not present

## 2014-10-03 DIAGNOSIS — E559 Vitamin D deficiency, unspecified: Secondary | ICD-10-CM | POA: Diagnosis not present

## 2014-10-03 DIAGNOSIS — E785 Hyperlipidemia, unspecified: Secondary | ICD-10-CM | POA: Diagnosis not present

## 2014-10-03 DIAGNOSIS — H9190 Unspecified hearing loss, unspecified ear: Secondary | ICD-10-CM | POA: Diagnosis not present

## 2014-10-03 DIAGNOSIS — E039 Hypothyroidism, unspecified: Secondary | ICD-10-CM | POA: Diagnosis not present

## 2014-10-03 DIAGNOSIS — M8580 Other specified disorders of bone density and structure, unspecified site: Secondary | ICD-10-CM | POA: Diagnosis not present

## 2014-10-03 LAB — HEPATIC FUNCTION PANEL
ALT: 17 U/L (ref 7–35)
AST: 19 U/L (ref 13–35)
Alkaline Phosphatase: 67 U/L (ref 25–125)
Bilirubin, Total: 0.5 mg/dL

## 2014-10-03 LAB — CBC AND DIFFERENTIAL
HEMATOCRIT: 40 % (ref 36–46)
HEMOGLOBIN: 13.2 g/dL (ref 12.0–16.0)
Neutrophils Absolute: 3 /uL
Platelets: 341 10*3/uL (ref 150–399)
WBC: 5.7 10*3/mL

## 2014-10-03 LAB — BASIC METABOLIC PANEL
BUN: 8 mg/dL (ref 4–21)
CREATININE: 0.8 mg/dL (ref 0.5–1.1)
Glucose: 96 mg/dL
POTASSIUM: 4.5 mmol/L (ref 3.4–5.3)
SODIUM: 135 mmol/L — AB (ref 137–147)

## 2014-10-03 LAB — TSH: TSH: 3.9 u[IU]/mL (ref 0.41–5.90)

## 2014-10-15 DIAGNOSIS — Z23 Encounter for immunization: Secondary | ICD-10-CM | POA: Diagnosis not present

## 2014-12-31 DIAGNOSIS — I10 Essential (primary) hypertension: Secondary | ICD-10-CM | POA: Insufficient documentation

## 2014-12-31 DIAGNOSIS — I495 Sick sinus syndrome: Secondary | ICD-10-CM | POA: Insufficient documentation

## 2015-01-01 DIAGNOSIS — I495 Sick sinus syndrome: Secondary | ICD-10-CM | POA: Diagnosis not present

## 2015-01-01 DIAGNOSIS — I482 Chronic atrial fibrillation: Secondary | ICD-10-CM | POA: Diagnosis not present

## 2015-01-01 DIAGNOSIS — I10 Essential (primary) hypertension: Secondary | ICD-10-CM | POA: Diagnosis not present

## 2015-01-18 ENCOUNTER — Ambulatory Visit: Payer: Self-pay | Admitting: Family Medicine

## 2015-01-18 DIAGNOSIS — Z1231 Encounter for screening mammogram for malignant neoplasm of breast: Secondary | ICD-10-CM | POA: Diagnosis not present

## 2015-02-20 DIAGNOSIS — H4011X1 Primary open-angle glaucoma, mild stage: Secondary | ICD-10-CM | POA: Diagnosis not present

## 2015-02-22 DIAGNOSIS — H4011X1 Primary open-angle glaucoma, mild stage: Secondary | ICD-10-CM | POA: Diagnosis not present

## 2015-04-05 NOTE — Op Note (Signed)
PATIENT NAME:  Savannah NeedsLINENS, Savannah Tucker MR#:  [id number] DATE OF BIRTH:  [birth year]  DATE OF PROCEDURE:  11/21/2013  PRIMARY CARE PHYSICIAN:  Dr. Sullivan LoneGilbert.  PREPROCEDURE DIAGNOSIS:  1.  Sick sinus syndrome.  2.  Atrial fibrillation.   PROCEDURE: Single-chamber pacemaker implantation.   POSTPROCEDURE DIAGNOSIS: Intermittent ventricular pacing.   INDICATION: The patient is an 81 year old female with history of atrial fibrillation. The patient recently has been experiencing episodes of dizziness. Holter monitor revealed episodic bradycardia with 3 second pauses alternating with atrial fibrillation with rapid ventricular rate consistent with tachybrady syndrome.   DETAILS OF PROCEDURE: The risks, benefits and alternatives of permanent pacemaker implantation were explained to the patient, and informed written consent was obtained.  She was brought to the operating room in a fasting state. The left pectoral region was prepped and draped in the usual sterile manner. Anesthesia was obtained with 1% Xylocaine locally. A 6 cm incision was performed over the left pectoral region. The pacemaker pocket was generated by electrocautery and blunt dissection. Access was obtained to the left subclavian vein by fine needle aspiration. A ventricular lead was positioned into the right ventricular apical septum. After proper thresholds were obtained, the lead was sutured in place. The pacemaker pocket was irrigated with gentamicin solution. The lead was connected to a single-chamber rate responsive pacemaker generator and positioned in the pocket. The pocket was closed with 2-0 and 4-0 Vicryl, respectively. Steri-Strips and pressure dressing were applied.    ____________________________ Marcina MillardAlexander Brentin Shin, MD ap:dmm D: 11/21/2013 13:15:59 ET T: 11/21/2013 13:32:56 ET JOB#: [id number]  cc: Marcina MillardAlexander Jaylenn Baiza, MD, <Dictator> Marcina MillardALEXANDER Brightyn Mozer MD ELECTRONICALLY SIGNED 12/01/2013 8:51

## 2015-04-18 DIAGNOSIS — Z96643 Presence of artificial hip joint, bilateral: Secondary | ICD-10-CM | POA: Diagnosis not present

## 2015-07-02 DIAGNOSIS — I10 Essential (primary) hypertension: Secondary | ICD-10-CM | POA: Diagnosis not present

## 2015-07-02 DIAGNOSIS — I38 Endocarditis, valve unspecified: Secondary | ICD-10-CM | POA: Diagnosis not present

## 2015-07-02 DIAGNOSIS — R600 Localized edema: Secondary | ICD-10-CM | POA: Diagnosis not present

## 2015-07-02 DIAGNOSIS — Z950 Presence of cardiac pacemaker: Secondary | ICD-10-CM | POA: Diagnosis not present

## 2015-07-02 DIAGNOSIS — I495 Sick sinus syndrome: Secondary | ICD-10-CM | POA: Diagnosis not present

## 2015-08-05 ENCOUNTER — Other Ambulatory Visit: Payer: Self-pay | Admitting: Family Medicine

## 2015-08-22 DIAGNOSIS — H4011X1 Primary open-angle glaucoma, mild stage: Secondary | ICD-10-CM | POA: Diagnosis not present

## 2015-10-15 DIAGNOSIS — Z23 Encounter for immunization: Secondary | ICD-10-CM | POA: Diagnosis not present

## 2015-11-01 DIAGNOSIS — I10 Essential (primary) hypertension: Secondary | ICD-10-CM | POA: Insufficient documentation

## 2015-11-01 DIAGNOSIS — L659 Nonscarring hair loss, unspecified: Secondary | ICD-10-CM | POA: Insufficient documentation

## 2015-11-01 DIAGNOSIS — I38 Endocarditis, valve unspecified: Secondary | ICD-10-CM | POA: Insufficient documentation

## 2015-11-01 DIAGNOSIS — E559 Vitamin D deficiency, unspecified: Secondary | ICD-10-CM | POA: Insufficient documentation

## 2015-11-01 DIAGNOSIS — E039 Hypothyroidism, unspecified: Secondary | ICD-10-CM | POA: Insufficient documentation

## 2015-11-01 DIAGNOSIS — H409 Unspecified glaucoma: Secondary | ICD-10-CM | POA: Insufficient documentation

## 2015-11-01 DIAGNOSIS — I482 Chronic atrial fibrillation, unspecified: Secondary | ICD-10-CM | POA: Insufficient documentation

## 2015-11-01 DIAGNOSIS — Z950 Presence of cardiac pacemaker: Secondary | ICD-10-CM | POA: Insufficient documentation

## 2015-11-01 DIAGNOSIS — M8580 Other specified disorders of bone density and structure, unspecified site: Secondary | ICD-10-CM | POA: Insufficient documentation

## 2015-11-01 DIAGNOSIS — E785 Hyperlipidemia, unspecified: Secondary | ICD-10-CM | POA: Insufficient documentation

## 2015-11-01 DIAGNOSIS — H9190 Unspecified hearing loss, unspecified ear: Secondary | ICD-10-CM | POA: Insufficient documentation

## 2015-11-06 ENCOUNTER — Ambulatory Visit (INDEPENDENT_AMBULATORY_CARE_PROVIDER_SITE_OTHER): Payer: Medicare Other | Admitting: Family Medicine

## 2015-11-06 ENCOUNTER — Encounter: Payer: Self-pay | Admitting: Family Medicine

## 2015-11-06 VITALS — BP 152/88 | HR 64 | Temp 97.6°F | Resp 16 | Ht 63.0 in | Wt 132.0 lb

## 2015-11-06 DIAGNOSIS — R29818 Other symptoms and signs involving the nervous system: Secondary | ICD-10-CM

## 2015-11-06 DIAGNOSIS — E039 Hypothyroidism, unspecified: Secondary | ICD-10-CM

## 2015-11-06 DIAGNOSIS — I10 Essential (primary) hypertension: Secondary | ICD-10-CM

## 2015-11-06 DIAGNOSIS — M1990 Unspecified osteoarthritis, unspecified site: Secondary | ICD-10-CM | POA: Diagnosis not present

## 2015-11-06 DIAGNOSIS — Z0000 Encounter for general adult medical examination without abnormal findings: Secondary | ICD-10-CM | POA: Diagnosis not present

## 2015-11-06 DIAGNOSIS — R2689 Other abnormalities of gait and mobility: Secondary | ICD-10-CM

## 2015-11-06 NOTE — Progress Notes (Signed)
Patient ID: Savannah Tucker, female   DOB: 11/17/1930, 81 y.o.   MRN: [id number] Visit Date: 11/06/2015  Today's Provider: Megan Mansichard Faelyn Sigler Jr, MD   Chief Complaint  Patient presents with  . Annual Exam   Subjective:   Savannah ClicheKathleen P Balding is a 81 y.o. female who presents today for her Subsequent Annual Wellness Visit. She feels well. She reports she is not exercising. She reports she is sleeping poorly.  Mammogram- 01/18/15 Pap-06/14/11 Colonoscopy- 10/19/01 BMD-07/19/13    Review of Systems  Constitutional: Negative.   HENT: Positive for sneezing.   Eyes: Positive for itching.  Respiratory: Negative.   Cardiovascular: Positive for leg swelling.  Gastrointestinal: Negative.   Endocrine: Negative.   Genitourinary: Negative.   Musculoskeletal: Positive for arthralgias and neck pain.  Skin: Negative.   Allergic/Immunologic: Negative.   Neurological: Negative.   Hematological: Negative.   Psychiatric/Behavioral: Negative.     Patient Active Problem List   Diagnosis Date Noted  . Alopecia 11/01/2015  . Chronic atrial fibrillation (HCC) 11/01/2015  . Decrease in the ability to hear 11/01/2015  . Glaucoma 11/01/2015  . HLD (hyperlipidemia) 11/01/2015  . BP (high blood pressure) 11/01/2015  . Adult hypothyroidism 11/01/2015  . Osteopenia 11/01/2015  . Artificial cardiac pacemaker 11/01/2015  . Endocarditis 11/01/2015  . Avitaminosis D 11/01/2015  . Sick sinus syndrome (HCC) 12/31/2014  . Benign essential HTN 12/31/2014    Social History   Social History  . Marital Status: Widowed    Spouse Name: N/A  . Number of Children: N/A  . Years of Education: N/A   Occupational History  . Not on file.   Social History Main Topics  . Smoking status: Former Games developermoker  . Smokeless tobacco: Not on file     Comment: 11/1998  . Alcohol Use: Yes     Comment: 1-2 glasses of wine a day  . Drug Use: No  . Sexual Activity: Not on file   Other Topics Concern  . Not on file   Social  History Narrative    Past Surgical History  Procedure Laterality Date  . Total hip arthroplasty Right   . Total hip arthroplasty Left   . Breast biopsy Left     benign  . Cataract extraction, bilateral    . Appendectomy      Her family history includes Asthma in her mother; Atrial fibrillation in her mother; Dementia in her brother; Diabetes in her brother, father, and son; Heart disease in her brother and father; Hypertension in her brother, father, and mother; Osteoporosis in her mother; Ulcers in her mother.    Outpatient Prescriptions Prior to Visit  Medication Sig Dispense Refill  . aspirin 325 MG tablet Take by mouth.    . bimatoprost (LUMIGAN) 0.01 % SOLN Apply to eye.    . calcium carbonate (OS-CAL) 600 MG TABS tablet Take by mouth.    . diltiazem (CARDIZEM LA) 120 MG 24 hr tablet Take by mouth.    . levothyroxine (SYNTHROID, LEVOTHROID) 25 MCG tablet TAKE 1 TABLET EVERY DAY 30 tablet 3  . timolol (TIMOPTIC) 0.5 % ophthalmic solution Apply to eye.     No facility-administered medications prior to visit.    No Known Allergies  Patient Care Team: Maple Hudsonichard L Jazma Pickel Jr., MD as PCP - General (Family Medicine)  Objective:   Vitals:  Filed Vitals:   11/06/15 1424  BP: 152/88  Pulse: 64  Temp: 97.6 F (36.4 C)  TempSrc: Oral  Resp: 16  Height: 5\' 3"  (  1.6 m)  Weight: 132 lb (59.875 kg)    Physical Exam  Constitutional: She is oriented to person, place, and time. She appears well-developed and well-nourished.  HENT:  Head: Normocephalic and atraumatic.  Right Ear: External ear normal.  Left Ear: External ear normal.  Nose: Nose normal.  Eyes: Conjunctivae are normal.  Neck: Neck supple.  Cardiovascular: Normal rate, regular rhythm, normal heart sounds and intact distal pulses.   Pulmonary/Chest: Effort normal and breath sounds normal.  Abdominal: Soft.  Neurological: She is alert and oriented to person, place, and time.  Skin: Skin is warm and dry.   Psychiatric: She has a normal mood and affect. Her behavior is normal. Judgment and thought content normal.    Activities of Daily Living In your present state of health, do you have any difficulty performing the following activities: 11/06/2015  Hearing? Y  Vision? N  Difficulty concentrating or making decisions? Y  Walking or climbing stairs? Y  Dressing or bathing? N  Doing errands, shopping? N    Fall Risk Assessment Fall Risk  11/06/2015  Falls in the past year? Yes  Number falls in past yr: 1  Injury with Fall? No     Depression Screen PHQ 2/9 Scores 11/06/2015  PHQ - 2 Score 0    Cognitive Testing - 6-CIT    Year: 0 points  Month: 0 points  Memorize "Floyde Parkins, [address], [place]"  Time (within 1 hour:) 0 points  Count backwards from 20: 0 points  Name months of year: 0 points  Repeat Address: 4 points    Total Score: 4/28  Interpretation : Normal (0-7) Abnormal (8-28)    Assessment & Plan:     Annual Wellness Visit  Reviewed patient's Family Medical History Reviewed and updated list of patient's medical providers Assessment of cognitive impairment was done Assessed patient's functional ability Established a written schedule for health screening services Health Risk Assessent Completed and Reviewed  Exercise Activities and Dietary recommendations Goals    None      Immunization History  Administered Date(s) Administered  . Influenza-Unspecified 09/13/2013  . Pneumococcal Polysaccharide-23 09/14/2007  . Td 07/30/2003  . Tdap 11/12/2014    Health Maintenance  Topic Date Due  . ZOSTAVAX  08/15/1992  . PNA vac Low Risk Adult (2 of 2 - PCV13) 09/13/2008  . INFLUENZA VACCINE  11/05/2016 (Originally 07/15/2015)  . TETANUS/TDAP  11/12/2024  . DEXA SCAN  Completed      Discussed health benefits of physical activity, and encouraged her to engage in regular exercise appropriate for her age and condition.   HTN Hypothyroid Arthritis Mild  Balance issues  Julieanne Manson MD [facility] 11/06/2015 2:28 PM  ------------------------------------------------------------------------------------------------------------

## 2015-11-07 LAB — CBC WITH DIFFERENTIAL/PLATELET
BASOS ABS: 0.1 10*3/uL (ref 0.0–0.2)
Basos: 1 %
EOS (ABSOLUTE): 0.2 10*3/uL (ref 0.0–0.4)
Eos: 4 %
HEMOGLOBIN: 13.1 g/dL (ref 11.1–15.9)
Hematocrit: 38.8 % (ref 34.0–46.6)
IMMATURE GRANS (ABS): 0 10*3/uL (ref 0.0–0.1)
IMMATURE GRANULOCYTES: 0 %
LYMPHS: 35 %
Lymphocytes Absolute: 2 10*3/uL (ref 0.7–3.1)
MCH: 33.6 pg — AB (ref 26.6–33.0)
MCHC: 33.8 g/dL (ref 31.5–35.7)
MCV: 100 fL — ABNORMAL HIGH (ref 79–97)
MONOCYTES: 9 %
Monocytes Absolute: 0.5 10*3/uL (ref 0.1–0.9)
NEUTROS ABS: 2.9 10*3/uL (ref 1.4–7.0)
Neutrophils: 51 %
PLATELETS: 347 10*3/uL (ref 150–379)
RBC: 3.9 x10E6/uL (ref 3.77–5.28)
RDW: 13.5 % (ref 12.3–15.4)
WBC: 5.6 10*3/uL (ref 3.4–10.8)

## 2015-11-07 LAB — COMPREHENSIVE METABOLIC PANEL
A/G RATIO: 1.6 (ref 1.1–2.5)
ALBUMIN: 4.7 g/dL (ref 3.5–4.7)
ALK PHOS: 62 IU/L (ref 39–117)
ALT: 26 IU/L (ref 0–32)
AST: 24 IU/L (ref 0–40)
BILIRUBIN TOTAL: 0.5 mg/dL (ref 0.0–1.2)
BUN / CREAT RATIO: 12 (ref 11–26)
BUN: 10 mg/dL (ref 8–27)
CHLORIDE: 95 mmol/L — AB (ref 97–106)
CO2: 23 mmol/L (ref 18–29)
Calcium: 10.2 mg/dL (ref 8.7–10.3)
Creatinine, Ser: 0.81 mg/dL (ref 0.57–1.00)
GFR, EST AFRICAN AMERICAN: 78 mL/min/{1.73_m2} (ref >59)
GFR, EST NON AFRICAN AMERICAN: 67 mL/min/{1.73_m2} (ref >59)
GLOBULIN, TOTAL: 2.9 g/dL (ref 1.5–4.5)
Glucose: 94 mg/dL (ref 65–99)
POTASSIUM: 4.5 mmol/L (ref 3.5–5.2)
SODIUM: 137 mmol/L (ref 136–144)
Total Protein: 7.6 g/dL (ref 6.0–8.5)

## 2015-11-07 LAB — TSH: TSH: 2.83 u[IU]/mL (ref 0.450–4.500)

## 2015-11-12 ENCOUNTER — Encounter: Payer: Self-pay | Admitting: Physical Therapy

## 2015-11-12 ENCOUNTER — Ambulatory Visit: Payer: Medicare Other | Attending: Family Medicine | Admitting: Physical Therapy

## 2015-11-12 DIAGNOSIS — R531 Weakness: Secondary | ICD-10-CM | POA: Diagnosis not present

## 2015-11-12 DIAGNOSIS — R269 Unspecified abnormalities of gait and mobility: Secondary | ICD-10-CM | POA: Insufficient documentation

## 2015-11-12 NOTE — Therapy (Signed)
[facility] [address] [place], [place], [address] Phone: [phone]   Fax:  [phone]  Physical Therapy Evaluation  Patient Details  Name: Savannah ClicheKathleen P Tucker MRN: [id number] Date of Birth: 09/18/1930 Referring Provider: Dr. Sullivan LoneGilbert  Encounter Date: 11/12/2015      PT End of Session - 11/12/15 1459    Visit Number 1   Number of Visits 9   Date for PT Re-Evaluation 12/10/15   Authorization Type Gcodes 1   Authorization Time Period 10   PT Start Time 1400   PT Stop Time 1455   PT Time Calculation (min) 55 min   Equipment Utilized During Treatment Gait belt   Activity Tolerance Patient tolerated treatment well   Behavior During Therapy [facility] for tasks assessed/performed      History reviewed. No pertinent past medical history.  Past Surgical History  Procedure Laterality Date  . Total hip arthroplasty Right   . Total hip arthroplasty Left   . Breast biopsy Left     benign  . Cataract extraction, bilateral    . Appendectomy      There were no vitals filed for this visit.  Visit Diagnosis:  Abnormality of gait - Plan: PT plan of care cert/re-cert  Weakness - Plan: PT plan of care cert/re-cert      Subjective Assessment - 11/12/15 1403    Subjective 81 yo Female reports impaired balance over last few months. She reports that she is coming to PT to improve her balance so that she doesn't fall. Patient is s/p right hip replacement (2009) and reports tending to fall to the right ever since.  Patient denies any numbness or tingling; she denies any dizziness; She doesn't use any assistive device on regular basis.    Pertinent History Bilateral hip replacement 2009, pacemaker placement 2014; shortness of breath on exertion (worse with cold and humid weather); moderate difficulty sleeping at night; will sleep on average 5-6 hours per night; sometimes she will take naps during day (fall asleep in chair)   How long can  you sit comfortably? 2+ hours   How long can you stand comfortably? 1+ hour   How long can you walk comfortably? hip discomfort and short of breath after walking >500 feet or more   Diagnostic tests none recent; cardiologist gave a good report at last visit   Patient Stated Goals "I don't want to stumble and fall" be able to get into SUV            [facility] Assessment - 11/12/15 0001    Assessment   Medical Diagnosis Impaired balance   Referring Provider Dr. Murvin DonningGilbert   Hand Dominance Right   Next MD Visit November 2017   Prior Therapy had PT following hip surgeries, with good results; She denies any PT for this condition;    Precautions   Precautions ICD/Pacemaker;Fall   Restrictions   Weight Bearing Restrictions No   Balance Screen   Has the patient fallen in the past 6 months Yes   How many times? 2  last fall was a few weeks ago, fell trying to go into [place]   Has the patient had a decrease in activity level because of a fear of falling?  No   Is the patient reluctant to leave their home because of a fear of falling?  No   Home Environment   Additional Comments lives in a condo; she has 5 steps to enter house with B  rails; lives alone; independent in all ADLs; still driving;    Prior Function   Level of Independence Independent   Leisure loves to go out to eat with friends; go to see movies;    Cognition   Overall Cognitive Status Within Functional Limits for tasks assessed   Observation/Other Assessments   Activities of Balance Confidence Scale (ABC Scale)  43/45 simplified test; high confidence and high physical functioning   Circumferential Edema   Circumferential - Right --  no swelling noted today; has swelling in BLE with hot weathe   Sensation   Light Touch Appears Intact   Coordination   Gross Motor Movements are Fluid and Coordinated Yes   Fine Motor Movements are Fluid and Coordinated Yes   Posture/Postural Control   Posture Comments demonstrates good erect  posture   AROM   Overall AROM Comments BUE and BLE AROM is [facility]   Strength   Overall Strength Comments BUE gross strength is 4/5; BLE hip grossly 4/5, knee and ankle 5/5   Palpation   Palpation comment reports moderate tenderness along left greater trochanter   Transfers   Comments able to transfer sit<>Stand without HHA;    Ambulation/Gait   Gait Comments ambulates on even surface without AD, demonstrating good gait speed, foot clearance, normal base of support;    Standardized Balance Assessment   Five times sit to stand comments  17.5 sec without HHA; (>60 yo,>15 sec indicates increased risk for falls);   10 Meter Walk 0.95 m/s without AD; limited community ambulator;    Hospital doctor   Sit to Stand Able to stand without using hands and stabilize independently   Standing Unsupported Able to stand safely 2 minutes   Sitting with Back Unsupported but Feet Supported on Floor or Stool Able to sit safely and securely 2 minutes   Stand to Sit Sits safely with minimal use of hands   Transfers Able to transfer safely, minor use of hands   Standing Unsupported with Eyes Closed Able to stand 10 seconds safely   Standing Ubsupported with Feet Together Able to place feet together independently and stand 1 minute safely   From Standing, Reach Forward with Outstretched Arm Can reach confidently >25 cm (10")   From Standing Position, Pick up Object from Floor Able to pick up shoe safely and easily   From Standing Position, Turn to Look Behind Over each Shoulder Looks behind from both sides and weight shifts well   Turn 360 Degrees Able to turn 360 degrees safely in 4 seconds or less   Standing Unsupported, Alternately Place Feet on Step/Stool Able to stand independently and safely and complete 8 steps in 20 seconds   Standing Unsupported, One Foot in Front Able to take small step independently and hold 30 seconds   Standing on One Leg Able to lift leg independently and hold 5-10 seconds   Total  Score 53   Berg comment: 50% risk for falls;    High Level Balance   High Level Balance Comments Demonstrates unsteadiness when standing on airex balance pad with eyes closed; able to stand with eyes open no loss of balance;          TREATMENT: Initiated HEP- please see patient instructions: Tandem stance with 1-0 rail assist , 10 sec hold x1 each LE; SLS on firm surface with 1-0 rail assist, 10 sec hold x1 each LE; Forward/backward walking 10 feet x3 laps without HHA; Sit<>Stand without HHA x5 reps;  Patient required  min-moderate verbal/tactile cues for correct exercise technique.                   PT Education - 12/09/15 1459    Education provided Yes   Education Details initiated [employer]) Educated Patient   Methods Explanation;Verbal cues;Handout   Comprehension Verbalized understanding;Returned demonstration;Verbal cues required             PT Long Term Goals - 12/09/15 1600    PT LONG TERM GOAL #1   Title Patient will be independent in home exercise program to improve strength/mobility for better functional independence with ADLs. by 12/10/15   Time 4   Period Weeks   Status New   PT LONG TERM GOAL #2   Title Patient (> 45 years old) will complete five times sit to stand test in < 15 seconds indicating an increased LE strength and improved balance. by 12/10/15   Time 4   Period Weeks   Status New   PT LONG TERM GOAL #3   Title Patient will increase 10 meter walk test to >1.6m/s as to improve gait speed for better community ambulation and to reduce fall risk. by 12/10/15   Time 4   Period Weeks   Status New   PT LONG TERM GOAL #4   Title Patient will increase Functional Gait Assessment score to >20/30 as to reduce fall risk and improve dynamic gait safety with community ambulation by 12/10/15   Time 4   Period Weeks   Status New               Plan - 2015-12-09 1459    Clinical Impression Statement 81 yo Female was referred to  physical therapy with impaired balance. Patient reports 2 falls in last 6 months. She denies any numbness and tingling, denies any dizziness. Patient is able to ambulate without assistive device independently. She does ambulate at slightly slower gait speed as compared to community ambulator. She tested as a high fall risk with 5 times sit<>Stand. Patient demonstrates decreased vestibular input with impaired balance when standing on airex with eyes closed. She would benefit from additional skilled PT intervention to improve balance and gait safety;    Pt will benefit from skilled therapeutic intervention in order to improve on the following deficits Decreased endurance;Cardiopulmonary status limiting activity;Decreased activity tolerance;Decreased strength;Difficulty walking;Decreased balance;Decreased safety awareness   Rehab Potential Good   Clinical Impairments Affecting Rehab Potential positive: good PLOF, motivation; negative: co-morbidites, fall history   PT Frequency 2x / week   PT Duration 4 weeks   PT Treatment/Interventions Patient/family education;Gait training;Cryotherapy;Stair training;Functional mobility training;Therapeutic activities;Therapeutic exercise;Moist Heat;Neuromuscular re-education   PT Next Visit Plan work on dynamic balance exercise, FGA   PT Home Exercise Plan initiated- see patient instructions   Consulted and Agree with Plan of Care Patient          G-Codes - 09-Dec-2015 1603    Functional Assessment Tool Used 10 meter walk, 5 times sit<>Stand, Berg Balance assessment, clinical judgement   Functional Limitation Mobility: Walking and moving around   Mobility: Walking and Moving Around Current Status [phone]) At least 20 percent but less than 40 percent impaired, limited or restricted   Mobility: Walking and Moving Around Goal Status [phone]) At least 1 percent but less than 20 percent impaired, limited or restricted       Problem List Patient Active Problem List    Diagnosis Date Noted  . Arthritis 11/06/2015  . Alopecia 11/01/2015  .  Chronic atrial fibrillation (HCC) 11/01/2015  . Decrease in the ability to hear 11/01/2015  . Glaucoma 11/01/2015  . HLD (hyperlipidemia) 11/01/2015  . Adult hypothyroidism 11/01/2015  . Osteopenia 11/01/2015  . Artificial cardiac pacemaker 11/01/2015  . Endocarditis 11/01/2015  . Avitaminosis D 11/01/2015  . Sick sinus syndrome (HCC) 12/31/2014  . Benign essential HTN 12/31/2014    Hopkins,Margaret PT, DPT 11/12/2015, 4:05 PM  [facility] MAIN [facility] SERVICES [address] [place], [place], [address] Phone: [phone]   Fax:  [phone]  Name: Savannah Tucker MRN: [id number] Date of Birth: Mar 04, 1930

## 2015-11-12 NOTE — Patient Instructions (Signed)
SIT TO STAND: No Device   Sit with feet shoulder-width apart, on floor.(Make sure that you are in a chair that won't move like a chair against a wall or couch etc) Lean chest forward, raise hips up from surface. Straighten hips and knees. Weight bear equally on left and right sides. 10___ reps per set, _2__ sets per day, _5__ days per week Place left leg closer to sitting surface.  Copyright  VHI. All rights reserved.  Backward Walking   Walk backward, toes of each foot coming down first. Take long, even strides. Make sure you have a clear pathway with no obstructions when you do this. Stand beside counter and walk backward  And then walk forward doing opposite directions; repeat 10 laps 2x a day at least 5 days a week.  Copyright  VHI. All rights reserved.  Tandem Walking   Stand beside kitchen sink and place one foot in front of the other, lift your hand and try to hold position for 10 sec. Repeat with other foot in front; Repeat 5 reps with each foot in front 5 days a week.Balance: Unilateral   Attempt to balance on left leg, eyes open. Hold _5-10___ seconds.Start with holding onto counter and if you get your balance you can try to let go of counter. Repeat __5__ times per set. Do __1__ sets per session. Do __1__ sessions per day. Keep eyes open:   http://orth.exer.[email or web address]   Copyright  VHI. All rights reserved.     

## 2015-11-14 ENCOUNTER — Encounter: Payer: Medicare Other | Admitting: Physical Therapy

## 2015-11-18 ENCOUNTER — Encounter: Payer: Self-pay | Admitting: Physical Therapy

## 2015-11-18 ENCOUNTER — Ambulatory Visit: Payer: Medicare Other | Attending: Family Medicine | Admitting: Physical Therapy

## 2015-11-18 DIAGNOSIS — R531 Weakness: Secondary | ICD-10-CM | POA: Diagnosis not present

## 2015-11-18 DIAGNOSIS — R269 Unspecified abnormalities of gait and mobility: Secondary | ICD-10-CM | POA: Diagnosis not present

## 2015-11-18 DIAGNOSIS — R2681 Unsteadiness on feet: Secondary | ICD-10-CM | POA: Insufficient documentation

## 2015-11-18 NOTE — Therapy (Signed)
[facility] MAIN [facility] SERVICES [address] [place], [place], [address] Phone: [phone]   Fax:  [phone]  Physical Therapy Treatment  Patient Details  Name: Savannah Tucker MRN: [id number] Date of Birth: Jul 30, 1930 Referring Provider: Dr. Sullivan Lone  Encounter Date: 11/18/2015      PT End of Session - 11/18/15 1603    Visit Number 2   Number of Visits 9   Date for PT Re-Evaluation 12/10/15   Authorization Type Gcodes 2   Authorization Time Period 10   PT Start Time 1510   PT Stop Time 1550   PT Time Calculation (min) 40 min   Equipment Utilized During Treatment Gait belt   Activity Tolerance Patient tolerated treatment well   Behavior During Therapy [facility] for tasks assessed/performed      History reviewed. No pertinent past medical history.  Past Surgical History  Procedure Laterality Date  . Total hip arthroplasty Right   . Total hip arthroplasty Left   . Breast biopsy Left     benign  . Cataract extraction, bilateral    . Appendectomy      There were no vitals filed for this visit.  Visit Diagnosis:  Abnormality of gait  Weakness      Subjective Assessment - 11/18/15 1517    Subjective Patient reports feeling good today. She reports still having hip pain when she tries to lift her leg in the car. Denies any pain currently.   Pertinent History Bilateral hip replacement 2009, pacemaker placement 2014; shortness of breath on exertion (worse with cold and humid weather); moderate difficulty sleeping at night; will sleep on average 5-6 hours per night; sometimes she will take naps during day (fall asleep in chair)   How long can you sit comfortably? 2+ hours   How long can you stand comfortably? 1+ hour   How long can you walk comfortably? hip discomfort and short of breath after walking >500 feet or more   Diagnostic tests none recent; cardiologist gave a good report at last visit   Patient Stated Goals "I don't want to  stumble and fall" be able to get into SUV   Currently in Pain? No/denies         TREATMENT: Gait on treadmill 1.5 mph with 2 HHA, x3 min with cues to increase DF at heel strike and increase step length;  Standing on airex pad: Heel/toe raises x15 with 2-0 rail assist; Alternate toe taps on 4 inch step x15 with 2-0 rail assist, and min VCs to increase erect head/posture and to step back on airex pad for better balance support; One foot on airex pad, one foot on 4 inch step with BUE ball pass x10 each direction with each foot on step and mod VCs for better weight shift with trunk rotation for better balance control;  Resisted weighted gait on firm surface, 12.5# forward/backward, side/side 4 way, x3 laps each, min-mod A and mod Vcs for better weight shift with eccentric return for better balance control;  Sit<>Stand from regular chair with BUE overhead press 4# weighted ball x10;  On airex balance beam: Tandem stance with 0 HHA 10sec hold x4 each foot in front; Side stepping on balance beam with 1-0 rail assist x3 laps with min VCs for better trunk control and posture for better balance.  Patient required min VCs for balance stability, including to increase trunk control for less loss of balance with smaller base of support Patient presents to therapy wearing wedges.  She reports that she wears heels most of the time. PT educated patient on how heels/wedges could throw off balance due to uneven surface.                         PT Education - 11/18/15 1603    Education provided Yes   Education Details balance exercise   Person(s) Educated Patient   Methods Explanation;Verbal cues   Comprehension Verbalized understanding;Returned demonstration;Verbal cues required             PT Long Term Goals - 11/12/15 1600    PT LONG TERM GOAL #1   Title Patient will be independent in home exercise program to improve strength/mobility for better functional independence  with ADLs. by 12/10/15   Time 4   Period Weeks   Status New   PT LONG TERM GOAL #2   Title Patient (> 79 years old) will complete five times sit to stand test in < 15 seconds indicating an increased LE strength and improved balance. by 12/10/15   Time 4   Period Weeks   Status New   PT LONG TERM GOAL #3   Title Patient will increase 10 meter walk test to >1.6426m/s as to improve gait speed for better community ambulation and to reduce fall risk. by 12/10/15   Time 4   Period Weeks   Status New   PT LONG TERM GOAL #4   Title Patient will increase Functional Gait Assessment score to >20/30 as to reduce fall risk and improve dynamic gait safety with community ambulation by 12/10/15   Time 4   Period Weeks   Status New               Plan - 11/18/15 1604    Clinical Impression Statement Instructed patient in dynamic and static standing balance exercise. Patient had increased difficulty on uneven surfaces. She required mod VCs for better weight shift especially with resisted weighted gait for better balance. Patient would benefit from additional skilled PT intervention to improve balance and gait safety.   Pt will benefit from skilled therapeutic intervention in order to improve on the following deficits Decreased endurance;Cardiopulmonary status limiting activity;Decreased activity tolerance;Decreased strength;Difficulty walking;Decreased balance;Decreased safety awareness   Rehab Potential Good   Clinical Impairments Affecting Rehab Potential positive: good PLOF, motivation; negative: co-morbidites, fall history   PT Frequency 2x / week   PT Duration 4 weeks   PT Treatment/Interventions Patient/family education;Gait training;Cryotherapy;Stair training;Functional mobility training;Therapeutic activities;Therapeutic exercise;Moist Heat;Neuromuscular re-education   PT Next Visit Plan work on dynamic balance exercise, FGA, uneven surfaces   PT Home Exercise Plan initiated- see patient  instructions   Consulted and Agree with Plan of Care Patient        Problem List Patient Active Problem List   Diagnosis Date Noted  . Arthritis 11/06/2015  . Alopecia 11/01/2015  . Chronic atrial fibrillation (HCC) 11/01/2015  . Decrease in the ability to hear 11/01/2015  . Glaucoma 11/01/2015  . HLD (hyperlipidemia) 11/01/2015  . Adult hypothyroidism 11/01/2015  . Osteopenia 11/01/2015  . Artificial cardiac pacemaker 11/01/2015  . Endocarditis 11/01/2015  . Avitaminosis D 11/01/2015  . Sick sinus syndrome (HCC) 12/31/2014  . Benign essential HTN 12/31/2014    Trotter,Margaret PT, DPT 11/18/2015, 4:05 PM   [facility]IN [facility] [address] [place], [place], [address] Phone: [phone]   Fax:  [phone]  Name: Lovette ClicheKathleen P Decamp MRN: [id number] Date of Birth: 09/13/1930

## 2015-11-19 ENCOUNTER — Encounter: Payer: Medicare Other | Admitting: Physical Therapy

## 2015-11-20 ENCOUNTER — Ambulatory Visit: Payer: Medicare Other | Admitting: Physical Therapy

## 2015-11-20 DIAGNOSIS — R531 Weakness: Secondary | ICD-10-CM

## 2015-11-20 DIAGNOSIS — R269 Unspecified abnormalities of gait and mobility: Secondary | ICD-10-CM | POA: Diagnosis not present

## 2015-11-20 DIAGNOSIS — R2681 Unsteadiness on feet: Secondary | ICD-10-CM | POA: Diagnosis not present

## 2015-11-20 NOTE — Therapy (Signed)
[facility] [address] [place], [place], [address] Phone: [phone]   Fax:  [phone]  Physical Therapy Treatment  Patient Details  Name: Savannah ClicheKathleen P Tucker MRN: [id number] Date of Birth: 06/21/1930 Referring Provider: Dr. Sullivan LoneGilbert  Encounter Date: 11/20/2015      PT End of Session - 11/20/15 1603    Visit Number 3   Number of Visits 9   Date for PT Re-Evaluation 12/10/15   Authorization Type Gcodes 3   Authorization Time Period 10   PT Start Time 1515   PT Stop Time 1600   PT Time Calculation (min) 45 min   Equipment Utilized During Treatment Gait belt   Activity Tolerance Patient tolerated treatment well   Behavior [facility] Therapy [facility] for tasks assessed/performed      No past medical history on file.  Past Surgical History  Procedure Laterality Date  . Total hip arthroplasty Right   . Total hip arthroplasty Left   . Breast biopsy Left     benign  . Cataract extraction, bilateral    . Appendectomy      There were no vitals filed for this visit.  Visit Diagnosis:  Abnormality of gait  Weakness      Subjective Assessment - 11/20/15 1516    Subjective Patient reports she is having shin pain around her cuts from her "near fall" on Thanksgiving. Patient reports she has been diligent with her HEP.    Pertinent History Bilateral hip replacement 2009, pacemaker placement 2014; shortness of breath on exertion (worse with cold and humid weather); moderate difficulty sleeping at night; will sleep on average 5-6 hours per night; sometimes she will take naps during day (fall asleep in chair)   How long can you sit comfortably? 2+ hours   How long can you stand comfortably? 1+ hour   How long can you walk comfortably? hip discomfort and short of breath after walking >500 feet or more   Diagnostic tests none recent; cardiologist gave a good report at last visit   Patient Stated Goals "I don't want to stumble and  fall" be able to get into SUV   Currently in Pain? No/denies   Multiple Pain Sites No        Leg Press x 105# for 10 repetitions for 3 sets   Standing mini-squats x10 with cuing for wider stance to allow increased hip mobility.   Tandem stance evaluation - no difficulty , SLS- able to complete >5 seconds bilaterally, tandem gait x 3 separate attempts (improved with each, to finish session she was able to complete x 25' with minimal losses of balance which she recovered from independently)   Standing hip abductions x 10, dc'd as she felt pain in her L hip   Agility ladder training for biofeedback on step length, speed forward, marching, side stepping x 2 of each. No difficulties noted.   Side stepping with red t-band x 10 bilaterally for 2 sets (challenging)   Retro walking with red t-band, monster walking with red t-band x15' of each with hip extensor activation   Side step ups to 6" step (one loss of balance recovered with PT assistance) x 10 repetitions for 2 sets   Marching ambulation with SLS x 3 seconds x 20' with 2 instances of balance lost. Cuing for maintaining level shoulders and COM over her ankles.  PT Education - 11/20/15 1602    Education provided Yes   Education Details Progression of balance exercises.    Person(s) Educated Patient   Methods Explanation;Demonstration;Verbal cues;Handout   Comprehension Verbalized understanding;Returned demonstration             PT Long Term Goals - 11/12/15 1600    PT LONG TERM GOAL #1   Title Patient will be independent in home exercise program to improve strength/mobility for better functional independence with ADLs. by 12/10/15   Time 4   Period Weeks   Status New   PT LONG TERM GOAL #2   Title Patient (> 79 years old) will complete five times sit to stand test in < 15 seconds indicating an increased LE strength and improved balance. by 12/10/15   Time 4   Period Weeks    Status New   PT LONG TERM GOAL #3   Title Patient will increase 10 meter walk test to >1.13m/s as to improve gait speed for better community ambulation and to reduce fall risk. by 12/10/15   Time 4   Period Weeks   Status New   PT LONG TERM GOAL #4   Title Patient will increase Functional Gait Assessment score to >20/30 as to reduce fall risk and improve dynamic gait safety with community ambulation by 12/10/15   Time 4   Period Weeks   Status New               Plan - 11/20/15 1603    Clinical Impression Statement Patient progressing well towards established balance goals. She demonstrates several instances of loss of balance with single leg strengthening activities, as well as narrow BOS ambulation. Patient demonstrates lateral loss of balance during narrow BOS it appears as a result of decreased hip abductor/ER strength, which was addressed by providing side stepping exercise with band as HEP.    Pt will benefit from skilled therapeutic intervention in order to improve on the following deficits Decreased endurance;Cardiopulmonary status limiting activity;Decreased activity tolerance;Decreased strength;Difficulty walking;Decreased balance;Decreased safety awareness   Rehab Potential Good   Clinical Impairments Affecting Rehab Potential positive: good PLOF, motivation; negative: co-morbidites, fall history   PT Frequency 2x / week   PT Duration 4 weeks   PT Treatment/Interventions Patient/family education;Gait training;Cryotherapy;Stair training;Functional mobility training;Therapeutic activities;Therapeutic exercise;Moist Heat;Neuromuscular re-education   PT Next Visit Plan Progress hip abductor/ER strengthening, dynamic balance activities, uneven surfaces, FGA.    PT Home Exercise Plan Added side stepping with yellow t-band.    Consulted and Agree with Plan of Care Patient        Problem List Patient Active Problem List   Diagnosis Date Noted  . Arthritis 11/06/2015  .  Alopecia 11/01/2015  . Chronic atrial fibrillation (HCC) 11/01/2015  . Decrease in the ability to hear 11/01/2015  . Glaucoma 11/01/2015  . HLD (hyperlipidemia) 11/01/2015  . Adult hypothyroidism 11/01/2015  . Osteopenia 11/01/2015  . Artificial cardiac pacemaker 11/01/2015  . Endocarditis 11/01/2015  . Avitaminosis D 11/01/2015  . Sick sinus syndrome (HCC) 12/31/2014  . Benign essential HTN 12/31/2014   Kerin Ransom, PT, DPT    11/20/2015, 4:07 PM  [place] [facility] MAIN [facility] SERVICES [address] [place], [place], [address] Phone: [phone]   Fax:  [phone]  Name: Savannah Tucker MRN: [id number] Date of Birth: 11-13-1930

## 2015-11-21 ENCOUNTER — Encounter: Payer: Medicare Other | Admitting: Physical Therapy

## 2015-11-26 ENCOUNTER — Encounter: Payer: Medicare Other | Admitting: Physical Therapy

## 2015-11-28 ENCOUNTER — Encounter: Payer: Medicare Other | Admitting: Physical Therapy

## 2015-11-28 ENCOUNTER — Ambulatory Visit: Payer: Medicare Other | Admitting: Physical Therapy

## 2015-11-28 ENCOUNTER — Encounter: Payer: Self-pay | Admitting: Physical Therapy

## 2015-11-28 DIAGNOSIS — R269 Unspecified abnormalities of gait and mobility: Secondary | ICD-10-CM

## 2015-11-28 DIAGNOSIS — R531 Weakness: Secondary | ICD-10-CM

## 2015-11-28 DIAGNOSIS — R2681 Unsteadiness on feet: Secondary | ICD-10-CM | POA: Diagnosis not present

## 2015-11-28 NOTE — Therapy (Signed)
[place] MAIN [facility] SERVICES [address] [place], [place], [address] Phone: [phone]   Fax:  [phone]  Physical Therapy Treatment /Progress Note 11/12/15 to 11/28/15  Patient Details  Name: Savannah Tucker MRN: [id number] Date of Birth: [birth year] Referring Provider: Dr. Rosanna Randy  Encounter Date: 11/28/2015      PT End of Session - 11/28/15 1253    Visit Number 4   Number of Visits 9   Date for PT Re-Evaluation 12/10/15   Authorization Type Gcodes 4   Authorization Time Period 10   PT Start Time 1115   PT Stop Time 1200   PT Time Calculation (min) 45 min   Equipment Utilized During Treatment Gait belt   Activity Tolerance Patient tolerated treatment well   Behavior During Therapy [facility] for tasks assessed/performed      History reviewed. No pertinent past medical history.  Past Surgical History  Procedure Laterality Date  . Total hip arthroplasty Right   . Total hip arthroplasty Left   . Breast biopsy Left     benign  . Cataract extraction, bilateral    . Appendectomy      There were no vitals filed for this visit.  Visit Diagnosis:  Abnormality of gait  Weakness      Subjective Assessment - 11/28/15 1124    Subjective Patient reports doing well today; She denies any pain currently; She reports being busy with the holidays and would like to be on hold until after New years.    Pertinent History Bilateral hip replacement 2009, pacemaker placement 2014; shortness of breath on exertion (worse with cold and humid weather); moderate difficulty sleeping at night; will sleep on average 5-6 hours per night; sometimes she will take naps during day (fall asleep in chair)   How long can you sit comfortably? 2+ hours   How long can you stand comfortably? 1+ hour   How long can you walk comfortably? hip discomfort and short of breath after walking >500 feet or more   Diagnostic tests none recent; cardiologist gave a good report  at last visit   Patient Stated Goals "I don't want to stumble and fall" be able to get into SUV            [facility] PT Assessment - 11/28/15 0001    Standardized Balance Assessment   Five times sit to stand comments  12.5 sec without HHA; improved from initial eval on 11/12/15 which was 17.5 sec (low fall risk);    10 Meter Walk 1.12 m/s (community ambulator; improved from initial eval on 11/12/15 which was 0.95 m/s)   Functional Gait  Assessment   Gait Level Surface Walks 20 ft in less than 5.5 sec, no assistive devices, good speed, no evidence for imbalance, normal gait pattern, deviates no more than 6 in outside of the 12 in walkway width.   Change in Gait Speed Able to change speed, demonstrates mild gait deviations, deviates 6-10 in outside of the 12 in walkway width, or no gait deviations, unable to achieve a major change in velocity, or uses a change in velocity, or uses an assistive device.   Gait with Horizontal Head Turns Performs head turns smoothly with slight change in gait velocity (eg, minor disruption to smooth gait path), deviates 6-10 in outside 12 in walkway width, or uses an assistive device.   Gait with Vertical Head Turns Performs head turns with no change in gait. Deviates no more than 6 in outside 12  in walkway width.   Gait and Pivot Turn Pivot turns safely in greater than 3 sec and stops with no loss of balance, or pivot turns safely within 3 sec and stops with mild imbalance, requires small steps to catch balance.   Step Over Obstacle Is able to step over 2 stacked shoe boxes taped together (9 in total height) without changing gait speed. No evidence of imbalance.   Gait with Narrow Base of Support Ambulates less than 4 steps heel to toe or cannot perform without assistance.   Gait with Eyes Closed Cannot walk 20 ft without assistance, severe gait deviations or imbalance, deviates greater than 15 in outside 12 in walkway width or will not attempt task.   Ambulating  Backwards Cannot walk 20 ft without assistance, severe gait deviations or imbalance, deviates greater than 15 in outside 12 in walkway width or will not attempt task.   Steps Alternating feet, must use rail.   Total Score 17   FGA comment: <19/30 indicates increased risk for falls;         PT instructed patient in outcome measures to assess progress towards goals. Please see above. Patient had increased difficulty with functional gait assessment including backwards walking, walking with lateral head turns, walking with eyes closed and tandem gait. She required CGA-min A for balance during these tasks.  TREATMENT: Warm up on Nustep level 2 BUE/BLE x4 min (Unbilled);  Instructed patient in balance exercise: Tandem stance on airex balance beam with lateral head turns (#3 each) x5 each foot in front, min A; Side stepping on airex balance beam without rail assist x3 laps each direction;   Gait in hallway: Forward/backward gait with change in direction x100 feet (CGA); Forward gait with lateral head turns x200 feet; Forward gait with up/down head turns x100 feet;  Patient required min VCs for balance stability, including to increase trunk control for less loss of balance with smaller base of support Required mod VCs to increase gait speed with dynamic gait tasks in hallway;                     PT Education - 11/28/15 1253    Education provided Yes   Education Details balance exercise   Person(s) Educated Patient   Methods Explanation;Verbal cues   Comprehension Verbalized understanding;Returned demonstration;Verbal cues required             PT Long Term Goals - 11/28/15 1125    PT LONG TERM GOAL #1   Title Patient will be independent in home exercise program to improve strength/mobility for better functional independence with ADLs. by 12/10/15   Time 4   Period Weeks   Status On-going   PT LONG TERM GOAL #2   Title Patient (> 79 years old) will complete five  times sit to stand test in < 15 seconds indicating an increased LE strength and improved balance. by 12/10/15   Time 4   Period Weeks   Status Achieved   PT LONG TERM GOAL #3   Title Patient will increase 10 meter walk test to >1.29ms as to improve gait speed for better community ambulation and to reduce fall risk. by 12/10/15   Time 4   Period Weeks   Status Achieved   PT LONG TERM GOAL #4   Title Patient will increase Functional Gait Assessment score to >20/30 as to reduce fall risk and improve dynamic gait safety with community ambulation by 12/10/15   Time 4  Period Weeks   Status Partially Met               Plan - 13-Dec-2015 1254    Clinical Impression Statement Patient instructed in outcome measures to assess progress towards goals. She demonstrates improved sit<>Stand and gait speed. However patient continues to have difficulty with dynamic balance tasks. Patient expressed having a busy holiday schedule and would like to be on hold for now and then resume therapy after the holidays. She will be on hold for 2-3 weeks. She would benefit from additional skilled PT intervention to improve dynamic balance safety;    Pt will benefit from skilled therapeutic intervention in order to improve on the following deficits Decreased endurance;Cardiopulmonary status limiting activity;Decreased activity tolerance;Decreased strength;Difficulty walking;Decreased balance;Decreased safety awareness   Rehab Potential Good   Clinical Impairments Affecting Rehab Potential positive: good PLOF, motivation; negative: co-morbidites, fall history   PT Frequency 2x / week   PT Duration 4 weeks   PT Treatment/Interventions Patient/family education;Gait training;Cryotherapy;Stair training;Functional mobility training;Therapeutic activities;Therapeutic exercise;Moist Heat;Neuromuscular re-education   PT Next Visit Plan Progress hip abductor/ER strengthening, dynamic balance activities, uneven surfaces, FGA.     PT Home Exercise Plan continue as previously given;    Consulted and Agree with Plan of Care Patient          G-Codes - Dec 13, 2015 1256    Functional Assessment Tool Used 10 meter walk, 5 times sit<>Stand, Functional gait assessment, clinical judgement   Functional Limitation Mobility: Walking and moving around   Mobility: Walking and Moving Around Current Status (R5615) At least 20 percent but less than 40 percent impaired, limited or restricted   Mobility: Walking and Moving Around Goal Status [phone]) At least 1 percent but less than 20 percent impaired, limited or restricted      Problem List Patient Active Problem List   Diagnosis Date Noted  . Arthritis 11/06/2015  . Alopecia 11/01/2015  . Chronic atrial fibrillation ([place]) 11/01/2015  . Decrease in the ability to hear 11/01/2015  . Glaucoma 11/01/2015  . HLD (hyperlipidemia) 11/01/2015  . Adult hypothyroidism 11/01/2015  . Osteopenia 11/01/2015  . Artificial cardiac pacemaker 11/01/2015  . Endocarditis 11/01/2015  . Avitaminosis D 11/01/2015  . Sick sinus syndrome ([place]) 12/31/2014  . Benign essential HTN 12/31/2014    Trotter,Margaret PT, DPT 12-13-15, 12:56 PM  [place] MAIN [facility] SERVICES [address] [place], [place], [address] Phone: [phone]   Fax:  [phone]  Name: Savannah Tucker MRN: [id number] Date of Birth: 28-Jun-1930

## 2015-12-02 ENCOUNTER — Other Ambulatory Visit: Payer: Self-pay | Admitting: Family Medicine

## 2015-12-03 ENCOUNTER — Ambulatory Visit: Payer: Medicare Other | Admitting: Physical Therapy

## 2015-12-03 ENCOUNTER — Encounter: Payer: Medicare Other | Admitting: Physical Therapy

## 2015-12-05 ENCOUNTER — Encounter: Payer: Medicare Other | Admitting: Physical Therapy

## 2015-12-11 ENCOUNTER — Encounter: Payer: Medicare Other | Admitting: Physical Therapy

## 2015-12-11 ENCOUNTER — Ambulatory Visit: Payer: Medicare Other | Admitting: Physical Therapy

## 2015-12-13 ENCOUNTER — Encounter: Payer: Medicare Other | Admitting: Physical Therapy

## 2015-12-23 ENCOUNTER — Ambulatory Visit: Payer: Medicare Other | Admitting: Physical Therapy

## 2015-12-26 ENCOUNTER — Encounter: Payer: Self-pay | Admitting: Physical Therapy

## 2015-12-26 ENCOUNTER — Ambulatory Visit: Payer: Medicare Other | Attending: Family Medicine | Admitting: Physical Therapy

## 2015-12-26 DIAGNOSIS — R269 Unspecified abnormalities of gait and mobility: Secondary | ICD-10-CM | POA: Insufficient documentation

## 2015-12-26 DIAGNOSIS — R531 Weakness: Secondary | ICD-10-CM | POA: Diagnosis not present

## 2015-12-26 DIAGNOSIS — R29818 Other symptoms and signs involving the nervous system: Secondary | ICD-10-CM | POA: Diagnosis not present

## 2015-12-26 NOTE — Therapy (Signed)
[place] MAIN [facility] SERVICES [address] [place], [place], [address] Phone: [phone]   Fax:  [phone]  Physical Therapy Treatment  Patient Details  Name: Savannah Tucker MRN: [id number] Date of Birth: Apr 06, 1930 Referring Provider: Dr. Rosanna Randy  Encounter Date: 12/26/2015      PT End of Session - 12/26/15 1307    Visit Number 5   Number of Visits 13   Date for PT Re-Evaluation 01/23/16   Authorization Type Gcodes 1   Authorization Time Period 10   PT Start Time 1300   PT Stop Time 1345   PT Time Calculation (min) 45 min   Equipment Utilized During Treatment Gait belt   Activity Tolerance Patient tolerated treatment well   Behavior During Therapy [facility] for tasks assessed/performed      History reviewed. No pertinent past medical history.  Past Surgical History  Procedure Laterality Date  . Total hip arthroplasty Right   . Total hip arthroplasty Left   . Breast biopsy Left     benign  . Cataract extraction, bilateral    . Appendectomy      There were no vitals filed for this visit.  Visit Diagnosis:  Abnormality of gait  Weakness      Subjective Assessment - 12/26/15 1305    Subjective Patient reports having a great holiday; She denies any new falls; She reports not doing her exercises as she was busy with helping her family with the holidays; She reports doing a lot of cooking;    Pertinent History Bilateral hip replacement 2009, pacemaker placement 2014; shortness of breath on exertion (worse with cold and humid weather); moderate difficulty sleeping at night; will sleep on average 5-6 hours per night; sometimes she will take naps during day (fall asleep in chair)   How long can you sit comfortably? 2+ hours   How long can you stand comfortably? 1+ hour   How long can you walk comfortably? hip discomfort and short of breath after walking >500 feet or more   Diagnostic tests none recent; cardiologist gave a good  report at last visit   Patient Stated Goals "I don't want to stumble and fall" be able to get into SUV   Currently in Pain? No/denies            [facility] PT Assessment - 12/26/15 0001    High Level Balance   High Level Balance Comments SLS: R: 13 sec, L: 4 sec; tandem stance 10 sec consistently;    Functional Gait  Assessment   Gait Level Surface Walks 20 ft in less than 5.5 sec, no assistive devices, good speed, no evidence for imbalance, normal gait pattern, deviates no more than 6 in outside of the 12 in walkway width.   Change in Gait Speed Able to smoothly change walking speed without loss of balance or gait deviation. Deviate no more than 6 in outside of the 12 in walkway width.   Gait with Horizontal Head Turns Performs head turns smoothly with no change in gait. Deviates no more than 6 in outside 12 in walkway width   Gait with Vertical Head Turns Performs head turns with no change in gait. Deviates no more than 6 in outside 12 in walkway width.   Gait and Pivot Turn Pivot turns safely within 3 sec and stops quickly with no loss of balance.   Step Over Obstacle Is able to step over 2 stacked shoe boxes taped together (9 in total height) without  changing gait speed. No evidence of imbalance.   Gait with Narrow Base of Support Ambulates 4-7 steps.   Gait with Eyes Closed Walks 20 ft, slow speed, abnormal gait pattern, evidence for imbalance, deviates 10-15 in outside 12 in walkway width. Requires more than 9 sec to ambulate 20 ft.   Ambulating Backwards Walks 20 ft, slow speed, abnormal gait pattern, evidence for imbalance, deviates 10-15 in outside 12 in walkway width.   Steps Alternating feet, must use rail.   Total Score 23 (<19/30 indicates increased risk for falls)      TREATMENT: Warm up on Nustep level 2 BUE/BLE x4 min (Unbilled);  Instructed patient in functional gait assessment to determine dynamic balance;  Standing on trampoline: Heel/toe raises unsupported x10 min A  for balance; Alternate march x1 min with min Vcs to increase hip flexion and to stay in middle of trampoline for increased balance; Feet together with balloon tap with each UE x2 min with CGA for balance;  Resisted weighted gait with 12.5# stepping over small bolster x4 laps (forward/backward), side/side x3 laps; Patient requires mod A for balance control and mod VCs to increase weight shift for improved stance ability;  Reeducated patient in HEP with importance of working on SLS, tandem stance and backwards walking;                       PT Education - 12/26/15 1307    Education provided Yes   Education Details assessed balance, reported findings;    Person(s) Educated Patient   Methods Explanation;Verbal cues   Comprehension Verbalized understanding;Returned demonstration;Verbal cues required             PT Long Term Goals - 11/28/15 1125    PT LONG TERM GOAL #1   Title Patient will be independent in home exercise program to improve strength/mobility for better functional independence with ADLs. by 12/10/15   Time 4   Period Weeks   Status On-going   PT LONG TERM GOAL #2   Title Patient (> 80 years old) will complete five times sit to stand test in < 15 seconds indicating an increased LE strength and improved balance. by 12/10/15   Time 4   Period Weeks   Status Achieved   PT LONG TERM GOAL #3   Title Patient will increase 10 meter walk test to >1.35ms as to improve gait speed for better community ambulation and to reduce fall risk. by 12/10/15   Time 4   Period Weeks   Status Achieved   PT LONG TERM GOAL #4   Title Patient will increase Functional Gait Assessment score to >20/30 as to reduce fall risk and improve dynamic gait safety with community ambulation by 12/10/15   Time 4   Period Weeks   Status Partially Met               Plan - 12/26/15 1450    Clinical Impression Statement Patient instructed in functional gait assessment and other  balance exercise. Patient continues to have difficulty with advanced balance tasks. She is only able to maintain SLS for approixmiately 4 sec on LLE due to weakness. PT reeducated patient in HEP. Will continue to work on dynamic balance to improve safety within the home and community.    Pt will benefit from skilled therapeutic intervention in order to improve on the following deficits Decreased endurance;Cardiopulmonary status limiting activity;Decreased activity tolerance;Decreased strength;Difficulty walking;Decreased balance;Decreased safety awareness   Rehab Potential Good  Clinical Impairments Affecting Rehab Potential positive: good PLOF, motivation; negative: co-morbidites, fall history   PT Frequency 2x / week   PT Duration 4 weeks   PT Treatment/Interventions Patient/family education;Gait training;Cryotherapy;Stair training;Functional mobility training;Therapeutic activities;Therapeutic exercise;Moist Heat;Neuromuscular re-education   PT Next Visit Plan Progress hip abductor/ER strengthening, dynamic balance activities, uneven surfaces, FGA.    PT Home Exercise Plan continue as previously given;    Consulted and Agree with Plan of Care Patient        Problem List Patient Active Problem List   Diagnosis Date Noted  . Arthritis 11/06/2015  . Alopecia 11/01/2015  . Chronic atrial fibrillation ([place]) 11/01/2015  . Decrease in the ability to hear 11/01/2015  . Glaucoma 11/01/2015  . HLD (hyperlipidemia) 11/01/2015  . Adult hypothyroidism 11/01/2015  . Osteopenia 11/01/2015  . Artificial cardiac pacemaker 11/01/2015  . Endocarditis 11/01/2015  . Avitaminosis D 11/01/2015  . Sick sinus syndrome ([place]) 12/31/2014  . Benign essential HTN 12/31/2014    Derward Marple PT, DPT 12/26/2015, 2:55 PM  [place] MAIN [facility] SERVICES [address] [place], [place], [address] Phone: [phone]   Fax:  [phone]  Name: [place] Jon Laforest MRN:  [id number] Date of Birth: 1930/01/08

## 2015-12-30 ENCOUNTER — Encounter: Payer: Self-pay | Admitting: Physical Therapy

## 2015-12-30 ENCOUNTER — Ambulatory Visit: Payer: Medicare Other | Admitting: Physical Therapy

## 2015-12-30 DIAGNOSIS — R29818 Other symptoms and signs involving the nervous system: Secondary | ICD-10-CM | POA: Diagnosis not present

## 2015-12-30 DIAGNOSIS — R531 Weakness: Secondary | ICD-10-CM

## 2015-12-30 DIAGNOSIS — R269 Unspecified abnormalities of gait and mobility: Secondary | ICD-10-CM | POA: Diagnosis not present

## 2015-12-30 NOTE — Therapy (Signed)
[place] [facility] MAIN [facility] SERVICES [address] [place], [place], [address] Phone: [phone]   Fax:  [phone]  Physical Therapy Treatment  Patient Details  Name: Savannah Tucker MRN: [id number] Date of Birth: 06/29/1930 Referring Provider: Dr. Sullivan Lone  Encounter Date: 12/30/2015      PT End of Session - 12/30/15 1254    Visit Number 6   Number of Visits 13   Date for PT Re-Evaluation 01/23/16   Authorization Type Gcodes 2   Authorization Time Period 10   PT Start Time 1120   PT Stop Time 1150   PT Time Calculation (min) 30 min   Equipment Utilized During Treatment Gait belt   Activity Tolerance Patient tolerated treatment well   Behavior During Therapy [facility] for tasks assessed/performed      History reviewed. No pertinent past medical history.  Past Surgical History  Procedure Laterality Date  . Total hip arthroplasty Right   . Total hip arthroplasty Left   . Breast biopsy Left     benign  . Cataract extraction, bilateral    . Appendectomy      There were no vitals filed for this visit.  Visit Diagnosis:  Abnormality of gait  Weakness      Subjective Assessment - 12/30/15 1121    Subjective Patient reports doing well; She denies any new falls; She reports trying the exercises some at home without difficulty. "I didn't have as much time as I wanted, but they went okay"   Pertinent History Bilateral hip replacement 2009, pacemaker placement 2014; shortness of breath on exertion (worse with cold and humid weather); moderate difficulty sleeping at night; will sleep on average 5-6 hours per night; sometimes she will take naps during day (fall asleep in chair)   How long can you sit comfortably? 2+ hours   How long can you stand comfortably? 1+ hour   How long can you walk comfortably? hip discomfort and short of breath after walking >500 feet or more   Diagnostic tests none recent; cardiologist gave a good report at last visit    Patient Stated Goals "I don't want to stumble and fall" be able to get into SUV   Currently in Pain? No/denies      Warm up on Treadmill 1.5 mph with 2 HHA x4 min with cues to increase step length, increase DF at heel strike for better foot clearance;  Standing in parallel bars: One foot on airex x2, one foot on 4 inch step plus dynadisc with BUE ball pass side/side x10 each with each foot in front; One foot on airex x2, one foot on 4 inch step plus dynadisc with head turns side/side, up/down x5 each with min Vcs for gaze stabilization;  Gait in hallway: Forward gait tossing ball x150 feet with min VCs to increase step length for better reciprocal gait pattern; able to complete without veering side/side; Forward gait with head turns up/down, side/side x400 feet; Patient requires close supervision; able to complete with minimal veering. Patient does tend to lean to right side with increased right head turns.  Forward gait with passing ball side/side x100 feet each direction with min Vcs to avoid veering to right side when passing to right;  PT reeducated patient in HEP with instruction in static SLS and tandem stance; Patient able to hold position unsupported approximately 5-10 sec. She has increased difficulty with LLE SLS due to weakness. PT educated patient in head turns with tandem stance for increased challenge  to keep balance with narrow base of support.  Patient required min VCs for balance stability, including to increase trunk control for less loss of balance with smaller base of support                             PT Education - 12/30/15 1254    Education provided Yes   Education Details dynamic balance exercise   Person(s) Educated Patient   Methods Explanation;Verbal cues   Comprehension Verbalized understanding;Returned demonstration;Verbal cues required             PT Long Term Goals - 12/30/15 16100814    PT LONG TERM GOAL #1   Title Patient  will be independent in home exercise program to improve strength/mobility for better functional independence with ADLs. by 01/23/16   Time 4   Period Weeks   Status On-going   PT LONG TERM GOAL #2   Title Patient (> 360 years old) will complete five times sit to stand test in < 15 seconds indicating an increased LE strength and improved balance. by 01/23/16   Time 4   Period Weeks   Status Achieved   PT LONG TERM GOAL #3   Title Patient will increase 10 meter walk test to >1.9345m/s as to improve gait speed for better community ambulation and to reduce fall risk. by 01/23/16   Time 4   Period Weeks   Status Achieved   PT LONG TERM GOAL #4   Title Patient will increase Functional Gait Assessment score to >20/30 as to reduce fall risk and improve dynamic gait safety with community ambulation by 01/23/16   Time 4   Period Weeks   Status Achieved   PT LONG TERM GOAL #5   Title Patient will ambulate on even surface with head turns without veering side/side to improve dynamic balance with community ambulation by 01/23/16   Time 4   Period Weeks   Status New               Plan - 12/30/15 1254    Clinical Impression Statement Patient was instructed in advanced balance exercise. She has increased difficulty with narrow base of support with side/side head turns or reaching outside base of support. Patient required min VCs to improve weight shift with narrow base of support for better stance control. Patient would benefit from additional skilled PT intervention to improve balance and gait safety;    Pt will benefit from skilled therapeutic intervention in order to improve on the following deficits Decreased endurance;Cardiopulmonary status limiting activity;Decreased activity tolerance;Decreased strength;Difficulty walking;Decreased balance;Decreased safety awareness   Rehab Potential Good   Clinical Impairments Affecting Rehab Potential positive: good PLOF, motivation; negative: co-morbidites,  fall history   PT Frequency 2x / week   PT Duration 4 weeks   PT Treatment/Interventions Patient/family education;Gait training;Cryotherapy;Stair training;Functional mobility training;Therapeutic activities;Therapeutic exercise;Moist Heat;Neuromuscular re-education   PT Next Visit Plan Progress hip abductor/ER strengthening, dynamic balance activities, uneven surfaces, FGA.    PT Home Exercise Plan continue as previously given;    Consulted and Agree with Plan of Care Patient        Problem List Patient Active Problem List   Diagnosis Date Noted  . Arthritis 11/06/2015  . Alopecia 11/01/2015  . Chronic atrial fibrillation ([facility]) 11/01/2015  . Decrease in the ability to hear 11/01/2015  . Glaucoma 11/01/2015  . HLD (hyperlipidemia) 11/01/2015  . Adult hypothyroidism 11/01/2015  . Osteopenia 11/01/2015  .  Artificial cardiac pacemaker 11/01/2015  . Endocarditis 11/01/2015  . Avitaminosis D 11/01/2015  . Sick sinus syndrome ([facility]) 12/31/2014  . Benign essential HTN 12/31/2014    Trotter,Margaret PT, DPT 12/30/2015, 1:05 PM  [place] [facility] MAIN [facility] SERVICES [address] [place], [place], [address] Phone: [phone]   Fax:  [phone]  Name: Savannah Tucker MRN: [id number] Date of Birth: [birth year]

## 2015-12-30 NOTE — Addendum Note (Signed)
Addended by: Zettie PhoROTTER, Rhilynn Preyer E on: 12/30/2015 08:16 AM   Modules accepted: Orders

## 2016-01-02 ENCOUNTER — Encounter: Payer: Self-pay | Admitting: Physical Therapy

## 2016-01-02 ENCOUNTER — Ambulatory Visit: Payer: Medicare Other | Admitting: Physical Therapy

## 2016-01-02 DIAGNOSIS — I482 Chronic atrial fibrillation: Secondary | ICD-10-CM | POA: Diagnosis not present

## 2016-01-02 DIAGNOSIS — R29818 Other symptoms and signs involving the nervous system: Secondary | ICD-10-CM | POA: Diagnosis not present

## 2016-01-02 DIAGNOSIS — R531 Weakness: Secondary | ICD-10-CM

## 2016-01-02 DIAGNOSIS — I495 Sick sinus syndrome: Secondary | ICD-10-CM | POA: Diagnosis not present

## 2016-01-02 DIAGNOSIS — R269 Unspecified abnormalities of gait and mobility: Secondary | ICD-10-CM | POA: Diagnosis not present

## 2016-01-02 NOTE — Therapy (Signed)
[place] [facility] MAIN [facility] SERVICES [address] [place], [place], [address] Phone: [phone]   Fax:  [phone]  Physical Therapy Treatment  Patient Details  Name: Savannah Tucker MRN: [id number] Date of Birth: [birth year] Referring Provider: Dr. Sullivan Lone  Encounter Date: 01/02/2016      PT End of Session - 01/02/16 1346    Visit Number 7   Number of Visits 13   Date for PT Re-Evaluation 01/23/16   Authorization Type Gcodes 3   Authorization Time Period 10   PT Start Time 1300   PT Stop Time 1345   PT Time Calculation (min) 45 min   Equipment Utilized During Treatment Gait belt   Activity Tolerance Patient tolerated treatment well   Behavior During Therapy [facility] for tasks assessed/performed      History reviewed. No pertinent past medical history.  Past Surgical History  Procedure Laterality Date  . Total hip arthroplasty Right   . Total hip arthroplasty Left   . Breast biopsy Left     benign  . Cataract extraction, bilateral    . Appendectomy      There were no vitals filed for this visit.  Visit Diagnosis:  Abnormality of gait  Weakness      Subjective Assessment - 01/02/16 1308    Subjective Patient reports doing well; She reports still having trouble with her balance, veering side/side;    Pertinent History Bilateral hip replacement 2009, pacemaker placement 2014; shortness of breath on exertion (worse with cold and humid weather); moderate difficulty sleeping at night; will sleep on average 5-6 hours per night; sometimes she will take naps during day (fall asleep in chair)   How long can you sit comfortably? 2+ hours   How long can you stand comfortably? 1+ hour   How long can you walk comfortably? hip discomfort and short of breath after walking >500 feet or more   Diagnostic tests none recent; cardiologist gave a good report at last visit   Patient Stated Goals "I don't want to stumble and fall" be able to get into SUV    Currently in Pain? No/denies             Warm up on Treadmill 1.5 mph with 2-1 HHA x4 min with cues to increase step length, increase DF at heel strike for better foot clearance; instructed patient in head turns to increase balance challenge;  Forward gait in between cones/bolsters (narrow environment) to challenge patient to avoid veering: -with head turns side/side x100 feet -with head turns up/down x50 feet; -with ball pass side/side x50 feet; -with ball up/down/side/side with head turns x100 feet;  Patient continues to lean to right side and requires increased cues to avoid veering to right;  PT advanced HEP- see patient instructions: Forward/backward walking with bars on right side to improve proprioception to avoid veering to right, 12 feet x3 laps with head turns side/side, up/down; SLS with arms out to side and min VCs to try and keep arms even and use arms to help improve weight shift for better balance 10 sec hold x3 each LE; Marching in place x30 sec with mod VCs to stay in place and avoid veering side/side;   Standing IT band stretch 20 sec hold x2 bilaterally; Seated piriformis stretch 20 sec hold x2 bilaterally; Patient reports less tightness following stretches;  Patient required min VCs for balance stability, including to increase trunk control for less loss of balance with smaller base of support  PT Education - 01/02/16 1345    Education provided Yes   Education Details dynamic balance, HEP   Person(s) Educated Patient   Methods Explanation;Verbal cues;Handout   Comprehension Verbalized understanding;Returned demonstration;Verbal cues required             PT Long Term Goals - 12/30/15 0814    PT LONG TERM GOAL #1   Title Patient will be independent in home exercise program to improve strength/mobility for better functional independence with ADLs. by 01/23/16   Time 4   Period Weeks   Status On-going   PT LONG  TERM GOAL #2   Title Patient (> 1 years old) will complete five times sit to stand test in < 15 seconds indicating an increased LE strength and improved balance. by 01/23/16   Time 4   Period Weeks   Status Achieved   PT LONG TERM GOAL #3   Title Patient will increase 10 meter walk test to >1.38m/s as to improve gait speed for better community ambulation and to reduce fall risk. by 01/23/16   Time 4   Period Weeks   Status Achieved   PT LONG TERM GOAL #4   Title Patient will increase Functional Gait Assessment score to >20/30 as to reduce fall risk and improve dynamic gait safety with community ambulation by 01/23/16   Time 4   Period Weeks   Status Achieved   PT LONG TERM GOAL #5   Title Patient will ambulate on even surface with head turns without veering side/side to improve dynamic balance with community ambulation by 01/23/16   Time 4   Period Weeks   Status New               Plan - 01/02/16 1346    Clinical Impression Statement Patient instructed in dynamic balance safety with narrow environment and open environment working on reducing veering side/side. Patient required mod VCs for correct positioning and to increase gaze stabilization for improved balance. Patient would benefit from additional skilled PT intervention to improve balance and gait safety.    Pt will benefit from skilled therapeutic intervention in order to improve on the following deficits Decreased endurance;Cardiopulmonary status limiting activity;Decreased activity tolerance;Decreased strength;Difficulty walking;Decreased balance;Decreased safety awareness   Rehab Potential Good   Clinical Impairments Affecting Rehab Potential positive: good PLOF, motivation; negative: co-morbidites, fall history   PT Frequency 2x / week   PT Duration 4 weeks   PT Treatment/Interventions Patient/family education;Gait training;Cryotherapy;Stair training;Functional mobility training;Therapeutic activities;Therapeutic  exercise;Moist Heat;Neuromuscular re-education   PT Next Visit Plan work on dynamic balance;    PT Home Exercise Plan advanced- see patient instructions   Consulted and Agree with Plan of Care Patient        Problem List Patient Active Problem List   Diagnosis Date Noted  . Arthritis 11/06/2015  . Alopecia 11/01/2015  . Chronic atrial fibrillation ([facility]) 11/01/2015  . Decrease in the ability to hear 11/01/2015  . Glaucoma 11/01/2015  . HLD (hyperlipidemia) 11/01/2015  . Adult hypothyroidism 11/01/2015  . Osteopenia 11/01/2015  . Artificial cardiac pacemaker 11/01/2015  . Endocarditis 11/01/2015  . Avitaminosis D 11/01/2015  . Sick sinus syndrome ([facility]) 12/31/2014  . Benign essential HTN 12/31/2014    Dauntae Derusha PT, DPT 01/02/2016, 1:48 PM  [place] [facility] MAIN [facility] SERVICES [address] [place], [place], [address] Phone: [phone]   Fax:  [phone]  Name: Savannah Tucker MRN: [id number] Date of Birth: [birth year]

## 2016-01-02 NOTE — Patient Instructions (Addendum)
       Lateral Trunk / IT Band Stretch    Stand with ball to wall at hip level, other leg crossed in front of ball-side leg. Reach above head with arms and keep both feet on floor. Hold _15__ seconds. Do _2__ sets of _3__ repetitions.  Copyright  VHI. All rights reserved.  HIP: External Rotation    Sit at edge of surface. Cross one leg over other knee. Press down gently on knee. Hold _15__ seconds. _3__ reps per set, _2__ sets per day, _5__ days per week  Copyright  VHI. All rights reserved.  Backward Walking    Walk forward 15 feet, then walk backward, with couch to right side and try to avoid running into couch; While walking forward and backward, turn head side/side Repeat 5 laps;    Copyright  VHI. All rights reserved.  Marching In-Place    Stand on a line on the floor, march in place for 1 min trying to stay in same spot.  Do at least 1 time a day;  Copyright  VHI. All rights reserved.  Single Leg - Eyes Open    Standing on one leg, have arms out to side and try to keep arms even to improve balance in standing and improve shift to left side; Hold for 15 sec, repeat 5 times each side;   Copyright  VHI. All rights reserved.

## 2016-01-06 ENCOUNTER — Ambulatory Visit: Payer: Medicare Other | Admitting: Physical Therapy

## 2016-01-09 ENCOUNTER — Ambulatory Visit: Payer: Medicare Other | Admitting: Physical Therapy

## 2016-01-09 ENCOUNTER — Encounter: Payer: Self-pay | Admitting: Physical Therapy

## 2016-01-09 DIAGNOSIS — R269 Unspecified abnormalities of gait and mobility: Secondary | ICD-10-CM | POA: Diagnosis not present

## 2016-01-09 DIAGNOSIS — R531 Weakness: Secondary | ICD-10-CM

## 2016-01-09 DIAGNOSIS — R29818 Other symptoms and signs involving the nervous system: Secondary | ICD-10-CM | POA: Diagnosis not present

## 2016-01-09 NOTE — Therapy (Signed)
[facility] MAIN [facility] SERVICES [address] [place], [place], [address] Phone: [phone]   Fax:  [phone]  Physical Therapy Treatment  Patient Details  Name: Savannah Tucker MRN: [id number] Date of Birth: 03-22-1930 Referring Provider: Dr. Sullivan Lone  Encounter Date: 01/09/2016      PT End of Session - 01/09/16 1555    Visit Number 8   Number of Visits 13   Date for PT Re-Evaluation 01/23/16   Authorization Type Gcodes 4   Authorization Time Period 10   PT Start Time 1300   PT Stop Time 1345   PT Time Calculation (min) 45 min   Equipment Utilized During Treatment Gait belt   Activity Tolerance Patient tolerated treatment well   Behavior During Therapy [facility] for tasks assessed/performed      History reviewed. No pertinent past medical history.  Past Surgical History  Procedure Laterality Date  . Total hip arthroplasty Right   . Total hip arthroplasty Left   . Breast biopsy Left     benign  . Cataract extraction, bilateral    . Appendectomy      There were no vitals filed for this visit.  Visit Diagnosis:  Abnormality of gait  Weakness      Subjective Assessment - 01/09/16 1306    Subjective Patient reports feeling more still this morning; She reports doing some of her exercises at home and feels that the stretches have helped some with her hips; She reports less unsteadiness this week;    Pertinent History Bilateral hip replacement 2009, pacemaker placement 2014; shortness of breath on exertion (worse with cold and humid weather); moderate difficulty sleeping at night; will sleep on average 5-6 hours per night; sometimes she will take naps during day (fall asleep in chair)   How long can you sit comfortably? 2+ hours   How long can you stand comfortably? 1+ hour   How long can you walk comfortably? hip discomfort and short of breath after walking >500 feet or more   Diagnostic tests none recent; cardiologist gave a good  report at last visit   Patient Stated Goals "I don't want to stumble and fall" be able to get into SUV   Currently in Pain? No/denies     TREATMENT Warm up on Nustep BUE/BLE x4 min (Unbilled); Standing on trampoline: Alternate march x1 min with visual cues to avoid marching forward; Feet together: BUE ball pass left/right x10 each; Mini squat with BUE Ball chest press x10;  SLS on star, toe taps x3 each LE with min A for balance control and mod VCs to avoid loss of balance with improved weight shift;   Patient continues to lean to right side and requires increased cues to avoid veering to right;  Resisted weighted gait on red mat, 12.5#: forward/backward walking  Gait in hallway: Tossing ball to left side of wall and catching 100 feet x2 reps; Forward walking with eyes open/closed 20 feet x4 each with cues to improve central walking and avoid veering to right side; Holding ball at chest and crossing to left to facilitate left lean x200 feet;  Patient required min VCs for balance stability, including to increase trunk control for less loss of balance with smaller base of support                            PT Education - 01/09/16 1555    Education provided Yes   Education  Details dynamic balance, posture, gait stability;    Person(s) Educated Patient   Methods Explanation;Verbal cues   Comprehension Verbalized understanding;Returned demonstration;Verbal cues required             PT Long Term Goals - 12/30/15 1610    PT LONG TERM GOAL #1   Title Patient will be independent in home exercise program to improve strength/mobility for better functional independence with ADLs. by 01/23/16   Time 4   Period Weeks   Status On-going   PT LONG TERM GOAL #2   Title Patient (> 9 years old) will complete five times sit to stand test in < 15 seconds indicating an increased LE strength and improved balance. by 01/23/16   Time 4   Period Weeks   Status Achieved    PT LONG TERM GOAL #3   Title Patient will increase 10 meter walk test to >1.26m/s as to improve gait speed for better community ambulation and to reduce fall risk. by 01/23/16   Time 4   Period Weeks   Status Achieved   PT LONG TERM GOAL #4   Title Patient will increase Functional Gait Assessment score to >20/30 as to reduce fall risk and improve dynamic gait safety with community ambulation by 01/23/16   Time 4   Period Weeks   Status Achieved   PT LONG TERM GOAL #5   Title Patient will ambulate on even surface with head turns without veering side/side to improve dynamic balance with community ambulation by 01/23/16   Time 4   Period Weeks   Status New               Plan - 01/09/16 1556    Clinical Impression Statement patient instructed in advanced balance exercise today; She demonstrates less veering to right side with gait tasks today. patient would benefit from additional skilled PT Intervention to improve balance and gait safety;    Pt will benefit from skilled therapeutic intervention in order to improve on the following deficits Decreased endurance;Cardiopulmonary status limiting activity;Decreased activity tolerance;Decreased strength;Difficulty walking;Decreased balance;Decreased safety awareness   Rehab Potential Good   Clinical Impairments Affecting Rehab Potential positive: good PLOF, motivation; negative: co-morbidites, fall history   PT Frequency 2x / week   PT Duration 4 weeks   PT Treatment/Interventions Patient/family education;Gait training;Cryotherapy;Stair training;Functional mobility training;Therapeutic activities;Therapeutic exercise;Moist Heat;Neuromuscular re-education   PT Next Visit Plan work on dynamic balance; DC next visit   PT Home Exercise Plan continue as previously given   Consulted and Agree with Plan of Care Patient        Problem List Patient Active Problem List   Diagnosis Date Noted  . Arthritis 11/06/2015  . Alopecia 11/01/2015  .  Chronic atrial fibrillation (HCC) 11/01/2015  . Decrease in the ability to hear 11/01/2015  . Glaucoma 11/01/2015  . HLD (hyperlipidemia) 11/01/2015  . Adult hypothyroidism 11/01/2015  . Osteopenia 11/01/2015  . Artificial cardiac pacemaker 11/01/2015  . Endocarditis 11/01/2015  . Avitaminosis D 11/01/2015  . Sick sinus syndrome (HCC) 12/31/2014  . Benign essential HTN 12/31/2014    Tiphany Fayson PT, DPT 01/09/2016, 3:57 PM  [facility] MAIN [facility] SERVICES [address] [place], [place], [address] Phone: [phone]   Fax:  [phone]  Name: Savannah Tucker MRN: [id number] Date of Birth: 08/06/1930

## 2016-01-13 ENCOUNTER — Ambulatory Visit: Payer: Medicare Other | Admitting: Physical Therapy

## 2016-01-16 ENCOUNTER — Encounter: Payer: Self-pay | Admitting: Physical Therapy

## 2016-01-16 ENCOUNTER — Ambulatory Visit: Payer: Medicare Other | Attending: Family Medicine | Admitting: Physical Therapy

## 2016-01-16 DIAGNOSIS — R269 Unspecified abnormalities of gait and mobility: Secondary | ICD-10-CM | POA: Diagnosis not present

## 2016-01-16 DIAGNOSIS — R531 Weakness: Secondary | ICD-10-CM | POA: Insufficient documentation

## 2016-01-16 NOTE — Therapy (Signed)
[place] MAIN [facility] SERVICES [address] [place], [place], [address] Phone: [phone]   Fax:  [phone]  Physical Therapy Treatment/Discharge Summary  Patient Details  Name: Savannah Tucker MRN: [id number] Date of Birth: 1930-11-04 Referring Provider: Dr. Rosanna Randy  Encounter Date: 01/16/2016      PT End of Session - 01/16/16 1702    Visit Number 9   Number of Visits 13   Date for PT Re-Evaluation 01/23/16   Authorization Type Gcodes 1   Authorization Time Period 10   PT Start Time 1300   PT Stop Time 1330   PT Time Calculation (min) 30 min   Equipment Utilized During Treatment Gait belt   Activity Tolerance Patient tolerated treatment well   Behavior During Therapy [facility] for tasks assessed/performed      History reviewed. No pertinent past medical history.  Past Surgical History  Procedure Laterality Date  . Total hip arthroplasty Right   . Total hip arthroplasty Left   . Breast biopsy Left     benign  . Cataract extraction, bilateral    . Appendectomy      There were no vitals filed for this visit.  Visit Diagnosis:  Abnormality of gait  Weakness      Subjective Assessment - 01/16/16 1307    Subjective Patient reports feeling pretty good. She reports increased stiffness but no pain;    Pertinent History Bilateral hip replacement 2009, pacemaker placement 2014; shortness of breath on exertion (worse with cold and humid weather); moderate difficulty sleeping at night; will sleep on average 5-6 hours per night; sometimes she will take naps during day (fall asleep in chair)   How long can you sit comfortably? 2+ hours   How long can you stand comfortably? 1+ hour   How long can you walk comfortably? hip discomfort and short of breath after walking >500 feet or more   Diagnostic tests none recent; cardiologist gave a good report at last visit   Patient Stated Goals "I don't want to stumble and fall" be able to get into SUV    Currently in Pain? No/denies            [facility] PT Assessment - 01/16/16 0001    Standardized Balance Assessment   Five times sit to stand comments  13 sec without HHA (low fall risk)   10 Meter Walk 1.05 m/s without AD (comfortable speed, community ambulator)   High Level Balance   High Level Balance Comments SLS R: 9 sec, L: 7 sec   Functional Gait  Assessment   Gait Level Surface Walks 20 ft in less than 5.5 sec, no assistive devices, good speed, no evidence for imbalance, normal gait pattern, deviates no more than 6 in outside of the 12 in walkway width.   Change in Gait Speed Able to smoothly change walking speed without loss of balance or gait deviation. Deviate no more than 6 in outside of the 12 in walkway width.   Gait with Horizontal Head Turns Performs head turns smoothly with no change in gait. Deviates no more than 6 in outside 12 in walkway width   Gait with Vertical Head Turns Performs head turns with no change in gait. Deviates no more than 6 in outside 12 in walkway width.   Gait and Pivot Turn Pivot turns safely within 3 sec and stops quickly with no loss of balance.   Step Over Obstacle Is able to step over 2 stacked shoe boxes taped  together (9 in total height) without changing gait speed. No evidence of imbalance.   Gait with Narrow Base of Support Is able to ambulate for 10 steps heel to toe with no staggering.   Gait with Eyes Closed Walks 20 ft, uses assistive device, slower speed, mild gait deviations, deviates 6-10 in outside 12 in walkway width. Ambulates 20 ft in less than 9 sec but greater than 7 sec.   Ambulating Backwards Walks 20 ft, uses assistive device, slower speed, mild gait deviations, deviates 6-10 in outside 12 in walkway width.   Steps Alternating feet, must use rail.   Total Score 27   FGA comment: low fall risk; improved from last reassessment on 11/27/16 which was 17/30        TREATMENT: Warm up on Nustep level 2 BUE/BLE x5 min  (unbilled);  PT instructed patient in outcome measures to assess progress towards goals, See above; Patient demonstrates significant improvement in static balance being able to maintain SLS for >5 sec each LE;  PT also educated patient in calf stretches to reduce tightness in calf 20 sec hold x2 for increased tolerance with sleeping and less muscle cramps; PT educated patient in standing in corner for increased safety with working on static standing balance;  Patient required min-moderate verbal/tactile cues for correct exercise technique.                      PT Education - 01/16/16 1702    Education provided Yes   Education Details dynamic balance, gait stability;    Person(s) Educated Patient   Methods Explanation;Verbal cues   Comprehension Verbalized understanding;Returned demonstration;Verbal cues required             PT Long Term Goals - 01/16/16 1308    PT LONG TERM GOAL #1   Title Patient will be independent in home exercise program to improve strength/mobility for better functional independence with ADLs. by 01/23/16   Time 4   Period Weeks   Status Achieved   PT LONG TERM GOAL #2   Title Patient (82 years old) will complete five times sit to stand test in < 15 seconds indicating an increased LE strength and improved balance. by 01/23/16   Time 4   Period Weeks   Status Achieved   PT LONG TERM GOAL #3   Title Patient will increase 10 meter walk test to >1.59ms as to improve gait speed for better community ambulation and to reduce fall risk. by 01/23/16   Time 4   Period Weeks   Status Achieved   PT LONG TERM GOAL #4   Title Patient will increase Functional Gait Assessment score to >20/30 as to reduce fall risk and improve dynamic gait safety with community ambulation by 01/23/16   Time 4   Period Weeks   Status Achieved   PT LONG TERM GOAL #5   Title Patient will ambulate on even surface with head turns without veering side/side to improve  dynamic balance with community ambulation by 01/23/16   Time 4   Period Weeks   Status Achieved               Plan - 01/16/16 1706    Clinical Impression Statement Patient reports doing well. She reports doing well with her balance most of the time but somestimes having days where she will veer side/side; PT instructed patient in outcome measures to assess progress towards goals. She has met all goals and demonstrates independence in  HEP; Recommend discharge from PT at this time;    Pt will benefit from skilled therapeutic intervention in order to improve on the following deficits Decreased endurance;Cardiopulmonary status limiting activity;Decreased activity tolerance;Decreased strength;Difficulty walking;Decreased balance;Decreased safety awareness   Rehab Potential Good   Clinical Impairments Affecting Rehab Potential positive: good PLOF, motivation; negative: co-morbidites, fall history   PT Frequency 2x / week   PT Duration 4 weeks   PT Treatment/Interventions Patient/family education;Gait training;Cryotherapy;Stair training;Functional mobility training;Therapeutic activities;Therapeutic exercise;Moist Heat;Neuromuscular re-education   PT Next Visit Plan discharge from PT   PT Home Exercise Plan continue as previously given   Consulted and Agree with Plan of Care Patient          G-Codes - 01-29-16 1708    Functional Assessment Tool Used functional gait assessment, clinical judgement   Functional Limitation Mobility: Walking and moving around   Mobility: Walking and Moving Around Goal Status [phone]) At least 1 percent but less than 20 percent impaired, limited or restricted   Mobility: Walking and Moving Around Discharge Status [phone]) At least 1 percent but less than 20 percent impaired, limited or restricted      Problem List Patient Active Problem List   Diagnosis Date Noted  . Arthritis 11/06/2015  . Alopecia 11/01/2015  . Chronic atrial fibrillation ([place]) 11/01/2015   . Decrease in the ability to hear 11/01/2015  . Glaucoma 11/01/2015  . HLD (hyperlipidemia) 11/01/2015  . Adult hypothyroidism 11/01/2015  . Osteopenia 11/01/2015  . Artificial cardiac pacemaker 11/01/2015  . Endocarditis 11/01/2015  . Avitaminosis D 11/01/2015  . Sick sinus syndrome ([place]) 12/31/2014  . Benign essential HTN 12/31/2014     Keiasha Diep PT, DPT 01-29-16, 5:11 PM  [place] MAIN [facility] SERVICES [address] [place], [place], [address] Phone: [phone]   Fax:  [phone]  Name: Aila Terra Lechtenberg MRN: [id number] Date of Birth: [birth year]

## 2016-02-10 ENCOUNTER — Other Ambulatory Visit: Payer: Self-pay | Admitting: Family Medicine

## 2016-02-10 DIAGNOSIS — Z1231 Encounter for screening mammogram for malignant neoplasm of breast: Secondary | ICD-10-CM

## 2016-02-19 DIAGNOSIS — H401132 Primary open-angle glaucoma, bilateral, moderate stage: Secondary | ICD-10-CM | POA: Diagnosis not present

## 2016-02-21 ENCOUNTER — Ambulatory Visit
Admission: RE | Admit: 2016-02-21 | Discharge: 2016-02-21 | Disposition: A | Discharge status: Home / Self Care | Payer: Medicare Other | Source: Ambulatory Visit | Attending: Family Medicine | Admitting: Family Medicine

## 2016-02-21 ENCOUNTER — Other Ambulatory Visit: Payer: Self-pay | Admitting: Family Medicine

## 2016-02-21 DIAGNOSIS — Z1231 Encounter for screening mammogram for malignant neoplasm of breast: Secondary | ICD-10-CM | POA: Diagnosis not present

## 2016-02-25 DIAGNOSIS — H401131 Primary open-angle glaucoma, bilateral, mild stage: Secondary | ICD-10-CM | POA: Diagnosis not present

## 2016-03-20 ENCOUNTER — Ambulatory Visit (INDEPENDENT_AMBULATORY_CARE_PROVIDER_SITE_OTHER): Payer: Medicare Other | Admitting: Family Medicine

## 2016-03-20 ENCOUNTER — Encounter: Payer: Self-pay | Admitting: Family Medicine

## 2016-03-20 VITALS — BP 112/84 | HR 81 | Temp 97.6°F | Resp 14 | Wt 134.6 lb

## 2016-03-20 DIAGNOSIS — I482 Chronic atrial fibrillation, unspecified: Secondary | ICD-10-CM

## 2016-03-20 DIAGNOSIS — J45909 Unspecified asthma, uncomplicated: Secondary | ICD-10-CM

## 2016-03-20 DIAGNOSIS — I495 Sick sinus syndrome: Secondary | ICD-10-CM | POA: Diagnosis not present

## 2016-03-20 DIAGNOSIS — R4781 Slurred speech: Secondary | ICD-10-CM

## 2016-03-20 NOTE — Progress Notes (Signed)
Patient ID: Savannah Tucker, female   DOB: [birth year], 82 y.o.   MRN: [id number]   Patient: Savannah Tucker Female    DOB: July 16, 1930   82 y.o.   MRN: [id number] Visit Date: 03/20/2016  Today's Provider: Dortha Kern, PA   Chief Complaint  Patient presents with  . Aphasia   Subjective:    HPI Patient states she noticed her speech being slurred and difficulty pronouncing words 2 days ago. Patient denies numbness, weakness, headache, loss of consciousness, dysphagia, amaurosis, chest pains, palpitations, dyspnea or dizziness. Patient reports her son is worried she may have had a CVA.   Patient Active Problem List   Diagnosis Date Noted  . Arthritis 11/06/2015  . Alopecia 11/01/2015  . Chronic atrial fibrillation (HCC) 11/01/2015  . Decrease in the ability to hear 11/01/2015  . Glaucoma 11/01/2015  . HLD (hyperlipidemia) 11/01/2015  . Adult hypothyroidism 11/01/2015  . Osteopenia 11/01/2015  . Artificial cardiac pacemaker 11/01/2015  . Endocarditis 11/01/2015  . Avitaminosis D 11/01/2015  . Sick sinus syndrome (HCC) 12/31/2014  . Benign essential HTN 12/31/2014   Past Surgical History  Procedure Laterality Date  . Total hip arthroplasty Right   . Total hip arthroplasty Left   . Cataract extraction, bilateral    . Appendectomy    . Breast biopsy Left 2002    EXCISIONAL - NEG   Family History  Problem Relation Age of Onset  . Asthma Mother   . Hypertension Mother   . Atrial fibrillation Mother   . Osteoporosis Mother   . Ulcers Mother   . Diabetes Father   . Hypertension Father   . Heart disease Father   . Heart disease Brother   . Hypertension Brother   . Diabetes Brother   . Dementia Brother   . Diabetes Son    Previous Medications   ASPIRIN 325 MG TABLET    Take by mouth.   BIMATOPROST (LUMIGAN) 0.01 % SOLN    Apply to eye.   CALCIUM CARBONATE (OS-CAL) 600 MG TABS TABLET    Take by mouth.   DILTIAZEM (CARDIZEM LA) 120 MG 24 HR TABLET    Take by mouth.   LEVOTHYROXINE (SYNTHROID, LEVOTHROID) 25 MCG TABLET    TAKE 1 TABLET EVERY DAY   TIMOLOL (TIMOPTIC) 0.5 % OPHTHALMIC SOLUTION    Apply to eye.   No Known Allergies  Review of Systems  Constitutional: Negative.   HENT: Negative.   Eyes: Negative.   Respiratory: Negative.   Cardiovascular: Negative.   Gastrointestinal: Negative.   Endocrine: Negative.   Genitourinary: Negative.   Musculoskeletal: Negative.   Skin: Negative.   Allergic/Immunologic: Negative.   Neurological: Negative.   Hematological: Negative.   Psychiatric/Behavioral: Negative.     Social History  Substance Use Topics  . Smoking status: Former Games developer  . Smokeless tobacco: Not on file     Comment: 11/1998  . Alcohol Use: Yes     Comment: 1-2 glasses of wine a day   Objective:   BP 112/84 mmHg  Pulse 81  Temp(Src) 97.6 F (36.4 C) (Oral)  Resp 14  Wt 134 lb 9.6 oz (61.054 kg) Wt Readings from Last 3 Encounters:  03/20/16 134 lb 9.6 oz (61.054 kg)  11/06/15 132 lb (59.875 kg)  10/03/14 132 lb (59.875 kg)    Physical Exam  Constitutional: She is oriented to person, place, and time. She appears well-developed and well-nourished.  HENT:  Head: Normocephalic.  Right Ear: External ear normal.  Left Ear:  External ear normal.  Wearing hearing aids bilaterally.  Eyes: Conjunctivae and EOM are normal. Pupils are equal, round, and reactive to light. Left eye exhibits no discharge.  Left upper eyelid has slightly inflamed chalazion. Minimal discomfort.  Neck: Normal range of motion. Neck supple. No thyromegaly present.  Cardiovascular: Normal rate, regular rhythm and normal heart sounds.   Pulmonary/Chest: Effort normal and breath sounds normal.  Abdominal: Soft. Bowel sounds are normal.  Musculoskeletal: Normal range of motion.  History of bilateral hip replacements. No pain and good motion. Normal muscle strength throughout. Good grip.   Neurological: She is alert and oriented to person, place, and time.  She has normal strength. She is not disoriented. No sensory deficit.  Reflex Scores:      Bicep reflexes are 2+ on the right side and 2+ on the left side.      Patellar reflexes are 1+ on the right side and 1+ on the left side. Cranial nerves intact. Rare fumbling with the pronunciation of the word "[place]" during CIT-6 screening. No significant ataxia.  Skin: No rash noted.  Psychiatric: She has a normal mood and affect. Her behavior is normal.    Cognitive Testing - 6-CIT  Correct? Score   What year is it? yes 0 0 or 4  What month is it? yes 0 0 or 3  Memorize:    Floyde ParkinsJohn,  [place],  44,  [address],  [place],      What time is it? (within 1 hour) yes 0 0 or 3  Count backwards from 20 yes 0 0, 2, or 4  Name the months of the year yes 0 0, 2, or 4  Repeat name & address above yes 0 0, 2, 4, 6, 8, or 10       TOTAL SCORE  0/28   Interpretation:  Normal  Normal (0-7) Abnormal (8-28)      Assessment & Plan:     1. Slurring of speech    Noticed slight difficulty with words 2 days ago. Denies difficulty with swallowing, dizziness, dyspnea, or muscle weakness. Eating well and only difficulty with walking has been present since hip replacements. Mental status and cognitive screening normal. Slight balance issue to test heel-to-toe walking (essentially unchanged since bilateral hip replacements in the past - has had physical therapy the first of this year for balance issues). Will continue ASA daily and recheck any recurrence or persistence to indicate need for neurology referral.  2. Chronic atrial fibrillation (HCC)    Still on Cardizem-LA 120 mg qd and ASA 325 mg qd to maintain rhythm control. Regular rhythm today and no peripheral edema or JVD.  3. Sick sinus syndrome (HCC).    Well controlled by pacemaker placed in left upper chest in 2014. Stable.

## 2016-05-18 ENCOUNTER — Ambulatory Visit (INDEPENDENT_AMBULATORY_CARE_PROVIDER_SITE_OTHER): Payer: Medicare Other | Admitting: Family Medicine

## 2016-05-18 VITALS — BP 128/84 | HR 84 | Temp 97.8°F | Resp 14 | Wt 136.0 lb

## 2016-05-18 DIAGNOSIS — I10 Essential (primary) hypertension: Secondary | ICD-10-CM

## 2016-05-18 DIAGNOSIS — E039 Hypothyroidism, unspecified: Secondary | ICD-10-CM | POA: Diagnosis not present

## 2016-05-18 DIAGNOSIS — L659 Nonscarring hair loss, unspecified: Secondary | ICD-10-CM

## 2016-05-18 NOTE — Progress Notes (Signed)
Patient ID: Savannah ClicheKathleen P Tucker, female   DOB: 03/11/1930, 82 y.o.   MRN: [id number]    Subjective:  HPI  Patient has had issues with alopecia for at least a year but lately has gotten worse, hair comes out in patches, even her beautician lately said yes hair is just falling out. Last TSH check was in November 2016 and it was normal. No other issues present.  Prior to Admission medications   Medication Sig Start Date End Date Taking? Authorizing Provider  aspirin 325 MG tablet Take by mouth. 01/13/12   Historical Provider, MD  bimatoprost (LUMIGAN) 0.01 % SOLN Apply to eye. 01/13/12   Historical Provider, MD  calcium carbonate (OS-CAL) 600 MG TABS tablet Take by mouth. 12/28/12   Historical Provider, MD  diltiazem (CARDIZEM LA) 120 MG 24 hr tablet Take by mouth. 01/13/12   Historical Provider, MD  levothyroxine (SYNTHROID, LEVOTHROID) 25 MCG tablet TAKE 1 TABLET EVERY DAY 12/02/15   Maple Hudsonichard L Gilbert Jr., MD  timolol (TIMOPTIC) 0.5 % ophthalmic solution Apply to eye. 01/13/12   Historical Provider, MD    Patient Active Problem List   Diagnosis Date Noted  . Arthritis 11/06/2015  . Alopecia 11/01/2015  . Chronic atrial fibrillation (HCC) 11/01/2015  . Decrease in the ability to hear 11/01/2015  . Glaucoma 11/01/2015  . HLD (hyperlipidemia) 11/01/2015  . Adult hypothyroidism 11/01/2015  . Osteopenia 11/01/2015  . Artificial cardiac pacemaker 11/01/2015  . Endocarditis 11/01/2015  . Avitaminosis D 11/01/2015  . Sick sinus syndrome (HCC) 12/31/2014  . Benign essential HTN 12/31/2014    No past medical history on file.  Social History   Social History  . Marital Status: Widowed    Spouse Name: N/A  . Number of Children: N/A  . Years of Education: N/A   Occupational History  . Not on file.   Social History Main Topics  . Smoking status: Former Games developermoker  . Smokeless tobacco: Not on file     Comment: 11/1998  . Alcohol Use: Yes     Comment: 1-2 glasses of wine a day  . Drug Use: No   . Sexual Activity: Not on file   Other Topics Concern  . Not on file   Social History Narrative    No Known Allergies  Review of Systems  Constitutional: Negative.        Hair loss  Respiratory: Negative.   Cardiovascular: Negative.   Gastrointestinal: Negative.   Musculoskeletal: Negative.     Immunization History  Administered Date(s) Administered  . Influenza-Unspecified 09/13/2013  . Pneumococcal Polysaccharide-23 09/14/2007  . Td 07/30/2003  . Tdap 11/12/2014   Objective:  BP 128/84 mmHg  Pulse 84  Temp(Src) 97.8 F (36.6 C)  Resp 14  Wt 136 lb (61.689 kg)  Physical Exam  Constitutional: She is well-developed, well-nourished, and in no distress.  HENT:  Head: Normocephalic and atraumatic.  Right Ear: External ear normal.  Left Ear: External ear normal.  Nose: Nose normal.  Thinning hair on the crown of her head  Eyes: Conjunctivae are normal. Pupils are equal, round, and reactive to light.  Neck: Normal range of motion. Neck supple. No thyromegaly present.  Cardiovascular: Normal rate and regular rhythm.   Pulmonary/Chest: Effort normal and breath sounds normal.  Abdominal: Soft.  Neurological: She is alert.  Skin: Skin is warm and dry. No rash noted. No erythema.  Hair loss present in crown of head  Psychiatric: Mood, memory, affect and judgment normal.    Lab Results  Component Value Date   WBC 5.6 11/06/2015   HGB 13.2 10/03/2014   HCT 38.8 11/06/2015   PLT 347 11/06/2015   GLUCOSE 94 11/06/2015   CHOL 192 11/28/2013   TRIG 116 11/28/2013   HDL 57 11/28/2013   LDLCALC 112 11/28/2013   TSH 2.830 11/06/2015   INR 1.0 11/20/2013    CMP     Component Value Date/Time   NA 137 11/06/2015 1514   NA 136 11/20/2013 1041   NA 137 08/31/2008 0524   K 4.5 11/06/2015 1514   K 3.9 11/20/2013 1041   CL 95* 11/06/2015 1514   CL 103 11/20/2013 1041   CO2 23 11/06/2015 1514   CO2 29 11/20/2013 1041   GLUCOSE 94 11/06/2015 1514   GLUCOSE 100*  11/20/2013 1041   GLUCOSE 88 08/31/2008 0524   BUN 10 11/06/2015 1514   BUN 9 11/20/2013 1041   BUN 5* 08/31/2008 0524   CREATININE 0.81 11/06/2015 1514   CREATININE 0.8 10/03/2014   CREATININE 0.84 11/20/2013 1041   CALCIUM 10.2 11/06/2015 1514   CALCIUM 9.8 11/20/2013 1041   PROT 7.6 11/06/2015 1514   PROT 7.1 08/21/2008 1457   ALBUMIN 4.7 11/06/2015 1514   ALBUMIN 4.4 08/21/2008 1457   AST 24 11/06/2015 1514   ALT 26 11/06/2015 1514   ALKPHOS 62 11/06/2015 1514   BILITOT 0.5 11/06/2015 1514   BILITOT 1.0 08/21/2008 1457   GFRNONAA 67 11/06/2015 1514   GFRNONAA >60 11/20/2013 1041   GFRAA 78 11/06/2015 1514   GFRAA >60 11/20/2013 1041    Assessment and Plan :  1. Alopecia Worsening. Check TSH, discussed options with patient -if TSH level comes back normal 1st option to do nothing and follow, 2nd patient can try Rogain for women, 3rd is to be referred to dermatologist. Patient can also check with pharmacist to see if they changed manufacturer for her medications. Follow pending results.  2. Hypothyroidism, unspecified hypothyroidism type Will recheck levels today, pending results this could be de to TSH level been off.  3. Benign essential HTN Stable.  Patient was seen and examined by Dr. Bosie Clos and note was scribed by Savannah Tucker, RMA.    Julieanne Manson MD [facility] 05/18/2016 1:39 PM

## 2016-05-19 LAB — TSH: TSH: 4.71 u[IU]/mL — ABNORMAL HIGH (ref 0.450–4.500)

## 2016-05-20 ENCOUNTER — Telehealth: Payer: Self-pay

## 2016-05-20 NOTE — Telephone Encounter (Signed)
[id number] 

## 2016-05-20 NOTE — Telephone Encounter (Signed)
-----   Message from Maple Hudsonichard L Gilbert Jr., MD sent at 05/20/2016  8:32 AM EDT ----- Increase synthroid from 25 to 50mcg daily.

## 2016-05-25 MED ORDER — LEVOTHYROXINE SODIUM 50 MCG PO TABS: 25.0000 ug | ORAL_TABLET | Freq: Every day | ORAL | Status: DC

## 2016-05-25 NOTE — Telephone Encounter (Signed)
[id number] 

## 2016-05-25 NOTE — Telephone Encounter (Signed)
Pt advised and RX sent in-aa 

## 2016-06-25 DIAGNOSIS — I482 Chronic atrial fibrillation: Secondary | ICD-10-CM | POA: Diagnosis not present

## 2016-06-25 DIAGNOSIS — I38 Endocarditis, valve unspecified: Secondary | ICD-10-CM | POA: Diagnosis not present

## 2016-06-25 DIAGNOSIS — I495 Sick sinus syndrome: Secondary | ICD-10-CM | POA: Diagnosis not present

## 2016-08-20 ENCOUNTER — Ambulatory Visit (INDEPENDENT_AMBULATORY_CARE_PROVIDER_SITE_OTHER): Payer: Medicare Other | Admitting: Family Medicine

## 2016-08-20 VITALS — BP 128/76 | HR 84 | Temp 97.6°F | Resp 16 | Wt 136.0 lb

## 2016-08-20 DIAGNOSIS — I10 Essential (primary) hypertension: Secondary | ICD-10-CM

## 2016-08-20 DIAGNOSIS — E039 Hypothyroidism, unspecified: Secondary | ICD-10-CM | POA: Diagnosis not present

## 2016-08-20 DIAGNOSIS — L509 Urticaria, unspecified: Secondary | ICD-10-CM

## 2016-08-20 MED ORDER — RANITIDINE HCL 150 MG PO TABS: 150.0000 mg | ORAL_TABLET | Freq: Two times a day (BID) | ORAL | 0 refills | Status: DC

## 2016-08-20 MED ORDER — PREDNISONE 10 MG (21) PO TBPK: ORAL_TABLET | ORAL | 1 refills | Status: DC

## 2016-08-20 MED ORDER — LORATADINE 10 MG PO TABS: 10.0000 mg | ORAL_TABLET | Freq: Every day | ORAL | 2 refills | Status: DC

## 2016-08-20 NOTE — Progress Notes (Signed)
Subjective:  HPI  Patient developed hives all over her body. Started 3 days ago. Itching and burning. No fever. She has tried a new shampoo and thinks this is why she has this now. She has been using Benadryl, anti itching cream OTC and Tylenol.  Prior to Admission medications   Medication Sig Start Date End Date Taking? Authorizing Provider  aspirin 325 MG tablet Take by mouth. 01/13/12  Yes Historical Provider, MD  bimatoprost (LUMIGAN) 0.01 % SOLN Apply to eye. 01/13/12  Yes Historical Provider, MD  calcium carbonate (OS-CAL) 600 MG TABS tablet Take by mouth. 12/28/12  Yes Historical Provider, MD  diltiazem (CARDIZEM LA) 120 MG 24 hr tablet Take by mouth. 01/13/12  Yes Historical Provider, MD  levothyroxine (SYNTHROID, LEVOTHROID) 50 MCG tablet Take 0.5 tablets (25 mcg total) by mouth daily. 05/25/16  Yes Savannah Hulen ShoutsL Gilbert Jr., MD  timolol (TIMOPTIC) 0.5 % ophthalmic solution Apply to eye. 01/13/12  Yes Historical Provider, MD    Patient Active Problem List   Diagnosis Date Noted  . Arthritis 11/06/2015  . Alopecia 11/01/2015  . Chronic atrial fibrillation (HCC) 11/01/2015  . Decrease in the ability to hear 11/01/2015  . Glaucoma 11/01/2015  . HLD (hyperlipidemia) 11/01/2015  . Adult hypothyroidism 11/01/2015  . Osteopenia 11/01/2015  . Artificial cardiac pacemaker 11/01/2015  . Endocarditis 11/01/2015  . Avitaminosis D 11/01/2015  . Sick sinus syndrome (HCC) 12/31/2014  . Benign essential HTN 12/31/2014    No past medical history on file.  Social History   Social History  . Marital status: Widowed    Spouse name: N/A  . Number of children: N/A  . Years of education: N/A   Occupational History  . Not on file.   Social History Main Topics  . Smoking status: Former Games developermoker  . Smokeless tobacco: Not on file     Comment: 11/1998  . Alcohol use Yes     Comment: 1-2 glasses of wine a day  . Drug use: No  . Sexual activity: Not on file   Other Topics Concern  . Not  on file   Social History Narrative  . No narrative on file    No Known Allergies  Review of Systems  Constitutional: Negative.   Respiratory: Negative.   Cardiovascular: Negative.   Musculoskeletal: Negative.   Skin: Positive for itching and rash.    Immunization History  Administered Date(s) Administered  . Influenza-Unspecified 09/13/2013  . Pneumococcal Polysaccharide-23 09/14/2007  . Td 07/30/2003  . Tdap 11/12/2014   Objective:  BP 128/76   Pulse 84   Temp 97.6 F (36.4 C)   Resp 16   Wt 136 lb (61.7 kg)   BMI 24.09 kg/m   Physical Exam  Constitutional: She is well-developed, well-nourished, and in no distress.  HENT:  Head: Normocephalic and atraumatic.  Eyes: Conjunctivae are normal. Pupils are equal, round, and reactive to light.  Neck: Normal range of motion. Neck supple.  Abdominal: Soft.  Skin: Rash noted. There is erythema.  Mild rash consistent with urticaria  Psychiatric: Mood, memory, affect and judgment normal.    Lab Results  Component Value Date   WBC 5.6 11/06/2015   HGB 13.2 10/03/2014   HCT 38.8 11/06/2015   PLT 347 11/06/2015   GLUCOSE 94 11/06/2015   CHOL 192 11/28/2013   TRIG 116 11/28/2013   HDL 57 11/28/2013   LDLCALC 112 11/28/2013   TSH 4.710 (H) 05/18/2016   INR 1.0 11/20/2013    CMP  Component Value Date/Time   NA 137 11/06/2015 1514   NA 136 11/20/2013 1041   K 4.5 11/06/2015 1514   K 3.9 11/20/2013 1041   CL 95 (L) 11/06/2015 1514   CL 103 11/20/2013 1041   CO2 23 11/06/2015 1514   CO2 29 11/20/2013 1041   GLUCOSE 94 11/06/2015 1514   GLUCOSE 100 (H) 11/20/2013 1041   BUN 10 11/06/2015 1514   BUN 9 11/20/2013 1041   CREATININE 0.81 11/06/2015 1514   CREATININE 0.84 11/20/2013 1041   CALCIUM 10.2 11/06/2015 1514   CALCIUM 9.8 11/20/2013 1041   PROT 7.6 11/06/2015 1514   ALBUMIN 4.7 11/06/2015 1514   AST 24 11/06/2015 1514   ALT 26 11/06/2015 1514   ALKPHOS 62 11/06/2015 1514   BILITOT 0.5 11/06/2015  1514   GFRNONAA 67 11/06/2015 1514   GFRNONAA >60 11/20/2013 1041   GFRAA 78 11/06/2015 1514   GFRAA >60 11/20/2013 1041    Assessment and Plan :  1. Urticaria Will treat with Prednisone, Loratadine and Zantac. If symptoms not better or get worse next week patient advised to call us and we can send in Doxepin 10 mg QID prn. Follow as needed. Check labs today too.  2. Benign essential HTN  I have done the exam and reviewed the above chart and it is accurate to the best of my knowledge.  HPI, Exam and A&P transcribed under direction and in the presence of Julieanne Manson, MD.

## 2016-08-20 NOTE — Patient Instructions (Signed)
Take Benadryl. Also sent Prednisone, Zantac and Loratadine. Call if symptoms get worse as you are finishing up Prednisone coarse.

## 2016-08-21 ENCOUNTER — Telehealth: Payer: Self-pay

## 2016-08-21 LAB — CBC WITH DIFFERENTIAL/PLATELET
BASOS ABS: 0 10*3/uL (ref 0.0–0.2)
Basos: 0 %
EOS (ABSOLUTE): 0 10*3/uL (ref 0.0–0.4)
EOS: 0 %
HEMATOCRIT: 37.7 % (ref 34.0–46.6)
HEMOGLOBIN: 12.6 g/dL (ref 11.1–15.9)
IMMATURE GRANULOCYTES: 0 %
Immature Grans (Abs): 0 10*3/uL (ref 0.0–0.1)
Lymphocytes Absolute: 0.8 10*3/uL (ref 0.7–3.1)
Lymphs: 9 %
MCH: 32.2 pg (ref 26.6–33.0)
MCHC: 33.4 g/dL (ref 31.5–35.7)
MCV: 96 fL (ref 79–97)
MONOCYTES: 1 %
Monocytes Absolute: 0.1 10*3/uL (ref 0.1–0.9)
NEUTROS PCT: 90 %
Neutrophils Absolute: 8.4 10*3/uL — ABNORMAL HIGH (ref 1.4–7.0)
Platelets: 309 10*3/uL (ref 150–379)
RBC: 3.91 x10E6/uL (ref 3.77–5.28)
RDW: 13.4 % (ref 12.3–15.4)
WBC: 9.3 10*3/uL (ref 3.4–10.8)

## 2016-08-21 LAB — COMPREHENSIVE METABOLIC PANEL
ALBUMIN: 4.3 g/dL (ref 3.5–4.7)
ALK PHOS: 68 IU/L (ref 39–117)
ALT: 19 IU/L (ref 0–32)
AST: 21 IU/L (ref 0–40)
Albumin/Globulin Ratio: 1.6 (ref 1.2–2.2)
BUN/Creatinine Ratio: 13 (ref 12–28)
BUN: 11 mg/dL (ref 8–27)
Bilirubin Total: 0.4 mg/dL (ref 0.0–1.2)
CALCIUM: 9.7 mg/dL (ref 8.7–10.3)
CO2: 21 mmol/L (ref 18–29)
CREATININE: 0.88 mg/dL (ref 0.57–1.00)
Chloride: 97 mmol/L (ref 96–106)
GFR calc Af Amer: 70 mL/min/{1.73_m2} (ref >59)
GFR, EST NON AFRICAN AMERICAN: 61 mL/min/{1.73_m2} (ref >59)
GLOBULIN, TOTAL: 2.7 g/dL (ref 1.5–4.5)
GLUCOSE: 127 mg/dL — AB (ref 65–99)
Potassium: 4.8 mmol/L (ref 3.5–5.2)
SODIUM: 134 mmol/L (ref 134–144)
Total Protein: 7 g/dL (ref 6.0–8.5)

## 2016-08-21 LAB — TSH: TSH: 1.89 u[IU]/mL (ref 0.450–4.500)

## 2016-08-21 NOTE — Telephone Encounter (Signed)
Advised pt of lab results. Pt verbally acknowledges understanding. Gautham Hewins Drozdowski, CMA   

## 2016-08-21 NOTE — Telephone Encounter (Signed)
-----   Message from Maple Hudsonichard L Gilbert Jr., MD sent at 08/21/2016  7:57 AM EDT ----- Labs OK

## 2016-08-31 DIAGNOSIS — H401131 Primary open-angle glaucoma, bilateral, mild stage: Secondary | ICD-10-CM | POA: Diagnosis not present

## 2016-11-09 ENCOUNTER — Encounter: Payer: Self-pay | Admitting: Family Medicine

## 2016-11-09 ENCOUNTER — Ambulatory Visit (INDEPENDENT_AMBULATORY_CARE_PROVIDER_SITE_OTHER): Payer: Medicare Other | Admitting: Family Medicine

## 2016-11-09 VITALS — BP 142/84 | HR 86 | Temp 97.6°F | Resp 16 | Ht 63.0 in | Wt 135.0 lb

## 2016-11-09 DIAGNOSIS — Z0000 Encounter for general adult medical examination without abnormal findings: Secondary | ICD-10-CM | POA: Diagnosis not present

## 2016-11-09 DIAGNOSIS — E039 Hypothyroidism, unspecified: Secondary | ICD-10-CM

## 2016-11-09 DIAGNOSIS — M1990 Unspecified osteoarthritis, unspecified site: Secondary | ICD-10-CM

## 2016-11-09 DIAGNOSIS — M8580 Other specified disorders of bone density and structure, unspecified site: Secondary | ICD-10-CM | POA: Diagnosis not present

## 2016-11-09 NOTE — Progress Notes (Signed)
Patient: Savannah Tucker, Female    DOB: 09/29/1930, 82 y.o.   MRN: [id number] Visit Date: 11/09/2016  Today's Provider: Megan Mansichard Lundon Verdejo Jr, MD   Chief Complaint  Patient presents with  . Medicare Wellness   Subjective:   Savannah Tucker is a 82 y.o. female who presents today for her Subsequent Annual Wellness Visit. She feels well. She reports she is not exercising. She reports she is sleeping poorly. She feels well. Her legs ache but particularly she has discomfort around her right knee with weightbearing.   Mammogram- 02/21/16 normal BMD- 07/19/13 Osteopenia Pap- 06/10/01 Colonoscopy- 10/19/01 diverticulosis, internal hemorrhoids, hyperplastic polyps  Pt had flu vaccine on 10/14/16 and Prevnar last [facility].   Immunization History  Administered Date(s) Administered  . Influenza-Unspecified 09/13/2013  . Pneumococcal Conjugate-13 10/15/2014  . Pneumococcal Polysaccharide-23 09/14/2007  . Td 07/30/2003  . Tdap 11/12/2014     Review of Systems  Constitutional: Negative.   HENT: Negative.   Eyes: Negative.   Respiratory: Negative.   Cardiovascular: Negative.   Gastrointestinal: Negative.   Endocrine: Negative.   Genitourinary: Negative.   Musculoskeletal: Negative.   Skin: Negative.   Allergic/Immunologic: Negative.   Neurological: Negative.   Hematological: Negative.   Psychiatric/Behavioral: Negative.     Patient Active Problem List   Diagnosis Date Noted  . Arthritis 11/06/2015  . Alopecia 11/01/2015  . Chronic atrial fibrillation (HCC) 11/01/2015  . Decrease in the ability to hear 11/01/2015  . Glaucoma 11/01/2015  . HLD (hyperlipidemia) 11/01/2015  . Adult hypothyroidism 11/01/2015  . Osteopenia 11/01/2015  . Artificial cardiac pacemaker 11/01/2015  . Endocarditis 11/01/2015  . Avitaminosis D 11/01/2015  . Sick sinus syndrome (HCC) 12/31/2014  . Benign essential HTN 12/31/2014    Social History   Social History  . Marital status:  Widowed    Spouse name: N/A  . Number of children: N/A  . Years of education: N/A   Occupational History  . Not on file.   Social History Main Topics  . Smoking status: Former Games developermoker  . Smokeless tobacco: Never Used     Comment: 11/1998  . Alcohol use Yes     Comment: 1-2 glasses of wine a day  . Drug use: No  . Sexual activity: Not on file   Other Topics Concern  . Not on file   Social History Narrative  . No narrative on file    Past Surgical History:  Procedure Laterality Date  . APPENDECTOMY    . BREAST BIOPSY Left 2002   EXCISIONAL - NEG  . CATARACT EXTRACTION, BILATERAL    . TOTAL HIP ARTHROPLASTY Right   . TOTAL HIP ARTHROPLASTY Left     Her family history includes Asthma in her mother; Atrial fibrillation in her mother; Dementia in her brother; Diabetes in her brother, father, and son; Heart disease in her brother and father; Hypertension in her brother, father, and mother; Osteoporosis in her mother; Ulcers in her mother.     Outpatient Medications Prior to Visit  Medication Sig Dispense Refill  . aspirin 325 MG tablet Take by mouth.    . bimatoprost (LUMIGAN) 0.01 % SOLN Apply to eye.    . calcium carbonate (OS-CAL) 600 MG TABS tablet Take by mouth.    . diltiazem (CARDIZEM LA) 120 MG 24 hr tablet Take by mouth.    . levothyroxine (SYNTHROID, LEVOTHROID) 50 MCG tablet Take 0.5 tablets (25 mcg total) by mouth daily. 30 tablet 12  . loratadine (CLARITIN) 10  MG tablet Take 1 tablet (10 mg total) by mouth daily. 30 tablet 2  . ranitidine (ZANTAC) 150 MG tablet Take 1 tablet (150 mg total) by mouth 2 (two) times daily. Just for 2 weeks only 28 tablet 0  . timolol (TIMOPTIC) 0.5 % ophthalmic solution Apply to eye.    . predniSONE (STERAPRED UNI-PAK 21 TAB) 10 MG (21) TBPK tablet As directed (Patient not taking: Reported on 11/09/2016) 21 tablet 1   No facility-administered medications prior to visit.     No Known Allergies  Patient Care Team: Maple Hudsonichard L  Shevette Bess Jr., MD as PCP - General (Family Medicine)   Objective:   Vitals:  Vitals:   11/09/16 1416  BP: (!) 142/84  Pulse: 86  Resp: 16  Temp: 97.6 F (36.4 C)  TempSrc: Oral  Weight: 135 lb (61.2 kg)  Height: 5\' 3"  (1.6 m)    Physical Exam  Constitutional: She is oriented to person, place, and time. She appears well-developed and well-nourished.  HENT:  Head: Normocephalic and atraumatic.  Right Ear: External ear normal.  Left Ear: External ear normal.  Nose: Nose normal.  Mouth/Throat: Oropharynx is clear and moist.  Eyes: Conjunctivae and EOM are normal. Pupils are equal, round, and reactive to light.  Neck: Normal range of motion. Neck supple.  Cardiovascular: Normal rate, regular rhythm, normal heart sounds and intact distal pulses.   Pulmonary/Chest: Effort normal and breath sounds normal.  Abdominal: Soft. Bowel sounds are normal.  Musculoskeletal: Normal range of motion.  Neurological: She is alert and oriented to person, place, and time. She has normal reflexes.  Skin: Skin is warm and dry.  Psychiatric: She has a normal mood and affect. Her behavior is normal. Judgment and thought content normal.    Activities of Daily Living In your present state of health, do you have any difficulty performing the following activities: 11/09/2016  Hearing? N  Vision? N  Difficulty concentrating or making decisions? N  Walking or climbing stairs? Y  Dressing or bathing? N  Doing errands, shopping? N  Some recent data might be hidden    Fall Risk Assessment Fall Risk  11/09/2016 11/06/2015  Falls in the past year? Yes Yes  Number falls in past yr: 1 1  Injury with Fall? No No     Depression Screen PHQ 2/9 Scores 11/09/2016 11/06/2015  PHQ - 2 Score 0 0    Cognitive Testing - 6-CIT    Year: 0 points  Month: 0 points  Memorize "Floyde ParkinsJohn, [place], [address], [address], [place]"  Time (within 1 hour:) 0 points  Count backwards from 20: 0 points  Name months of year: 0  points  Repeat Address: 2 points   Total Score: 2/28  Interpretation : Normal (0-7) Abnormal (8-28)    Assessment & Plan:     Annual Wellness Visit  Reviewed patient's Family Medical History Reviewed and updated list of patient's medical providers Assessment of cognitive impairment was done Assessed patient's functional ability Established a written schedule for health screening services Health Risk Assessent Completed and Reviewed  Exercise Activities and Dietary recommendations Goals    None      Immunization History  Administered Date(s) Administered  . Influenza-Unspecified 09/13/2013  . Pneumococcal Polysaccharide-23 09/14/2007  . Td 07/30/2003  . Tdap 11/12/2014    Health Maintenance  Topic Date Due  . ZOSTAVAX  08/15/1992  . PNA vac Low Risk Adult (2 of 2 - PCV13) 09/13/2008  . INFLUENZA VACCINE  07/14/2016  . TETANUS/TDAP  11/12/2024  . DEXA SCAN  Completed    Overall health is very good for this 82 year old. She wishes minimal intervention. Discussed health benefits of physical activity, and encouraged her to engage in regular exercise appropriate for her age and condition.  Hypothyroid--check TSH. Arthralgia/myalgia--likely knee osteoarthritis on the right History of a fibrillation Status post T HR Osteopenia in a patient at risk for osteoporosis--check on BMD  I have done the exam and reviewed the above chart and it is accurate to the best of my knowledge. Dentist has been used in this note in any air is in the dictation or transcription are unintentional.  Julieanne Manson MD [facility] 11/09/2016 2:24 PM  ------------------------------------------------------------------------------------------------------------

## 2016-11-10 ENCOUNTER — Telehealth: Payer: Self-pay

## 2016-11-10 LAB — TSH: TSH: 2.06 u[IU]/mL (ref 0.450–4.500)

## 2016-11-10 NOTE — Telephone Encounter (Signed)
Patient was advised. KW 

## 2016-11-10 NOTE — Telephone Encounter (Signed)
-----   Message from Maple Hudsonichard L Gilbert Jr., MD sent at 11/10/2016  8:16 AM EST ----- Thyroid normal.

## 2016-12-29 DIAGNOSIS — I495 Sick sinus syndrome: Secondary | ICD-10-CM | POA: Diagnosis not present

## 2016-12-29 DIAGNOSIS — I482 Chronic atrial fibrillation: Secondary | ICD-10-CM | POA: Diagnosis not present

## 2017-01-07 ENCOUNTER — Ambulatory Visit
Admission: RE | Admit: 2017-01-07 | Discharge: 2017-01-07 | Disposition: A | Discharge status: Home / Self Care | Payer: Medicare Other | Source: Ambulatory Visit | Attending: Family Medicine | Admitting: Family Medicine

## 2017-01-07 DIAGNOSIS — M8580 Other specified disorders of bone density and structure, unspecified site: Secondary | ICD-10-CM | POA: Diagnosis not present

## 2017-01-07 DIAGNOSIS — Z780 Asymptomatic menopausal state: Secondary | ICD-10-CM | POA: Diagnosis not present

## 2017-01-07 DIAGNOSIS — M85839 Other specified disorders of bone density and structure, unspecified forearm: Secondary | ICD-10-CM | POA: Diagnosis not present

## 2017-01-07 DIAGNOSIS — Z13820 Encounter for screening for osteoporosis: Secondary | ICD-10-CM | POA: Diagnosis present

## 2017-01-07 DIAGNOSIS — M8589 Other specified disorders of bone density and structure, multiple sites: Secondary | ICD-10-CM | POA: Diagnosis not present

## 2017-03-01 DIAGNOSIS — H401131 Primary open-angle glaucoma, bilateral, mild stage: Secondary | ICD-10-CM | POA: Diagnosis not present

## 2017-03-08 DIAGNOSIS — H401131 Primary open-angle glaucoma, bilateral, mild stage: Secondary | ICD-10-CM | POA: Diagnosis not present

## 2017-05-03 ENCOUNTER — Ambulatory Visit (INDEPENDENT_AMBULATORY_CARE_PROVIDER_SITE_OTHER): Payer: Medicare Other | Admitting: Family Medicine

## 2017-05-03 ENCOUNTER — Encounter: Payer: Self-pay | Admitting: Family Medicine

## 2017-05-03 VITALS — BP 138/86 | HR 92 | Temp 97.6°F | Resp 16 | Wt 137.0 lb

## 2017-05-03 DIAGNOSIS — E039 Hypothyroidism, unspecified: Secondary | ICD-10-CM

## 2017-05-03 DIAGNOSIS — I10 Essential (primary) hypertension: Secondary | ICD-10-CM | POA: Diagnosis not present

## 2017-05-03 DIAGNOSIS — E785 Hyperlipidemia, unspecified: Secondary | ICD-10-CM | POA: Diagnosis not present

## 2017-05-03 NOTE — Progress Notes (Signed)
Subjective:  HPI  Hypertension, follow-up:  BP Readings from Last 3 Encounters:  05/03/17 138/86  11/09/16 (!) 142/84  08/20/16 128/76    She was last seen for hypertension 6 months ago.  BP at that visit was 142/84. Management since that visit includes none. She reports good compliance with treatment. She is not having side effects.  She is exercising some but not lately.  She is adherent to low salt diet.   Outside blood pressures are not being checked. She is experiencing none.  Patient denies chest pain, chest pressure/discomfort, claudication, dyspnea, exertional chest pressure/discomfort, fatigue, irregular heart beat, lower extremity edema, near-syncope, orthopnea, palpitations, paroxysmal nocturnal dyspnea, syncope and tachypnea.   Cardiovascular risk factors include advanced age (older than 255 for men, 2865 for women) and hypertension.  Wt Readings from Last 3 Encounters:  05/03/17 137 lb (62.1 kg)  11/09/16 135 lb (61.2 kg)  08/20/16 136 lb (61.7 kg)   ------------------------------------------------------------------------    Prior to Admission medications   Medication Sig Start Date End Date Taking? Authorizing Provider  aspirin 325 MG tablet Take by mouth. 01/13/12   [provider]  bimatoprost (LUMIGAN) 0.01 % SOLN Apply to eye. 01/13/12   [provider]  calcium carbonate (OS-CAL) 600 MG TABS tablet Take by mouth. 12/28/12   [provider]  diltiazem (CARDIZEM LA) 120 MG 24 hr tablet Take by mouth. 01/13/12   [provider]  levothyroxine (SYNTHROID, LEVOTHROID) 50 MCG tablet Take 0.5 tablets (25 mcg total) by mouth daily. 05/25/16   Maple HudsonGilbert, Shawny Borkowski L Jr., MD  loratadine (CLARITIN) 10 MG tablet Take 1 tablet (10 mg total) by mouth daily. 08/20/16   Maple HudsonGilbert, Tijuan Dantes L Jr., MD  ranitidine (ZANTAC) 150 MG tablet Take 1 tablet (150 mg total) by mouth 2 (two) times daily. Just for 2 weeks only 08/20/16   Maple HudsonGilbert, Alyssa Rotondo L Jr., MD    timolol (TIMOPTIC) 0.5 % ophthalmic solution Apply to eye. 01/13/12   [provider]    Patient Active Problem List   Diagnosis Date Noted  . Arthritis 11/06/2015  . Alopecia 11/01/2015  . Chronic atrial fibrillation (HCC) 11/01/2015  . Decrease in the ability to hear 11/01/2015  . Glaucoma 11/01/2015  . HLD (hyperlipidemia) 11/01/2015  . Adult hypothyroidism 11/01/2015  . Osteopenia 11/01/2015  . Artificial cardiac pacemaker 11/01/2015  . Endocarditis 11/01/2015  . Avitaminosis D 11/01/2015  . Sick sinus syndrome (HCC) 12/31/2014  . Benign essential HTN 12/31/2014    History reviewed. No pertinent past medical history.  Social History   Social History  . Marital status: Widowed    Spouse name: N/A  . Number of children: N/A  . Years of education: N/A   Occupational History  . Not on file.   Social History Main Topics  . Smoking status: Former Games developermoker  . Smokeless tobacco: Never Used     Comment: 11/1998  . Alcohol use Yes     Comment: 1-2 glasses of wine a day  . Drug use: No  . Sexual activity: Not on file   Other Topics Concern  . Not on file   Social History Narrative  . No narrative on file    No Known Allergies  Review of Systems  Constitutional: Negative.   HENT: Negative.   Eyes: Negative.   Respiratory: Negative.   Cardiovascular: Negative.   Gastrointestinal: Negative.   Genitourinary: Negative.   Musculoskeletal: Negative.   Skin: Negative.   Neurological: Negative.   Endo/Heme/Allergies: Negative.  Psychiatric/Behavioral: Negative.     Immunization History  Administered Date(s) Administered  . Influenza-Unspecified 09/13/2013  . Pneumococcal Conjugate-13 10/15/2014  . Pneumococcal Polysaccharide-23 09/14/2007  . Td 07/30/2003  . Tdap 11/12/2014    Objective:  BP 138/86 (BP Location: Left Arm, Patient Position: Sitting, Cuff Size: Normal)   Pulse 92   Temp 97.6 F (36.4 C) (Oral)   Resp 16   Wt 137 lb (62.1 kg)    BMI 24.27 kg/m   Physical Exam  Constitutional: She is oriented to person, place, and time and well-developed, well-nourished, and in no distress.  HENT:  Head: Normocephalic and atraumatic.  Eyes: Conjunctivae and EOM are normal. Pupils are equal, round, and reactive to light.  Neck: Normal range of motion. Neck supple.  Cardiovascular: Normal rate, regular rhythm, normal heart sounds and intact distal pulses.   Pulmonary/Chest: Effort normal and breath sounds normal.  Abdominal: Soft.  Musculoskeletal: Normal range of motion.  Neurological: She is alert and oriented to person, place, and time. She has normal reflexes. Gait normal. GCS score is 15.  Skin: Skin is warm and dry.  Psychiatric: Mood, memory, affect and judgment normal.    Lab Results  Component Value Date   WBC 9.3 08/20/2016   HGB 13.2 10/03/2014   HCT 37.7 08/20/2016   PLT 309 08/20/2016   GLUCOSE 127 (H) 08/20/2016   CHOL 192 11/28/2013   TRIG 116 11/28/2013   HDL 57 11/28/2013   LDLCALC 112 11/28/2013   TSH 2.060 11/09/2016   INR 1.0 11/20/2013    CMP     Component Value Date/Time   NA 134 08/20/2016 1457   NA 136 11/20/2013 1041   K 4.8 08/20/2016 1457   K 3.9 11/20/2013 1041   CL 97 08/20/2016 1457   CL 103 11/20/2013 1041   CO2 21 08/20/2016 1457   CO2 29 11/20/2013 1041   GLUCOSE 127 (H) 08/20/2016 1457   GLUCOSE 100 (H) 11/20/2013 1041   BUN 11 08/20/2016 1457   BUN 9 11/20/2013 1041   CREATININE 0.88 08/20/2016 1457   CREATININE 0.84 11/20/2013 1041   CALCIUM 9.7 08/20/2016 1457   CALCIUM 9.8 11/20/2013 1041   PROT 7.0 08/20/2016 1457   ALBUMIN 4.3 08/20/2016 1457   AST 21 08/20/2016 1457   ALT 19 08/20/2016 1457   ALKPHOS 68 08/20/2016 1457   BILITOT 0.4 08/20/2016 1457   GFRNONAA 61 08/20/2016 1457   GFRNONAA >60 11/20/2013 1041   GFRAA 70 08/20/2016 1457   GFRAA >60 11/20/2013 1041    Assessment and Plan :  1. Benign essential HTN Stable. Continue current medications. Follow  up in 6 months.   2. Adult hypothyroidism   3. Hyperlipidemia, unspecified hyperlipidemia type    HPI, Exam, and A&P Transcribed under the direction and in the presence of Berklie Dethlefs L. Wendelyn Breslow, MD  Electronically Signed: Silvio Pate, CMA I have done the exam and reviewed the above chart and it is accurate to the best of my knowledge. Dentist has been used in this note in any air is in the dictation or transcription are unintentional.  Julieanne Manson MD [facility] 05/03/2017 2:11 PM

## 2017-06-15 ENCOUNTER — Other Ambulatory Visit: Payer: Self-pay | Admitting: Family Medicine

## 2017-07-13 DIAGNOSIS — I495 Sick sinus syndrome: Secondary | ICD-10-CM | POA: Diagnosis not present

## 2017-07-13 DIAGNOSIS — I482 Chronic atrial fibrillation: Secondary | ICD-10-CM | POA: Diagnosis not present

## 2017-07-13 DIAGNOSIS — I38 Endocarditis, valve unspecified: Secondary | ICD-10-CM | POA: Diagnosis not present

## 2017-07-13 DIAGNOSIS — I10 Essential (primary) hypertension: Secondary | ICD-10-CM | POA: Diagnosis not present

## 2017-07-13 DIAGNOSIS — R0602 Shortness of breath: Secondary | ICD-10-CM | POA: Diagnosis not present

## 2017-08-06 DIAGNOSIS — R0602 Shortness of breath: Secondary | ICD-10-CM | POA: Diagnosis not present

## 2017-08-11 DIAGNOSIS — I10 Essential (primary) hypertension: Secondary | ICD-10-CM | POA: Diagnosis not present

## 2017-08-11 DIAGNOSIS — I495 Sick sinus syndrome: Secondary | ICD-10-CM | POA: Diagnosis not present

## 2017-08-11 DIAGNOSIS — Z950 Presence of cardiac pacemaker: Secondary | ICD-10-CM | POA: Diagnosis not present

## 2017-08-11 DIAGNOSIS — I482 Chronic atrial fibrillation: Secondary | ICD-10-CM | POA: Diagnosis not present

## 2017-09-07 DIAGNOSIS — H401131 Primary open-angle glaucoma, bilateral, mild stage: Secondary | ICD-10-CM | POA: Diagnosis not present

## 2017-10-11 DIAGNOSIS — Z23 Encounter for immunization: Secondary | ICD-10-CM | POA: Diagnosis not present

## 2017-11-11 ENCOUNTER — Ambulatory Visit (INDEPENDENT_AMBULATORY_CARE_PROVIDER_SITE_OTHER): Payer: Medicare Other

## 2017-11-11 VITALS — BP 146/82 | HR 84 | Temp 97.3°F | Ht 63.0 in | Wt 133.2 lb

## 2017-11-11 DIAGNOSIS — Z0000 Encounter for general adult medical examination without abnormal findings: Secondary | ICD-10-CM

## 2017-11-11 NOTE — Progress Notes (Signed)
Subjective:   Savannah Tucker is a 83 y.o. female who presents for Medicare Annual (Subsequent) preventive examination.  Review of Systems:  N/A  Cardiac Risk Factors include: advanced age (>4055men, 70>65 women);dyslipidemia;hypertension;sedentary lifestyle     Objective:     Vitals: BP (!) 146/82 (BP Location: Left Arm)   Pulse 84   Temp (!) 97.3 F (36.3 C) (Oral)   Ht 5\' 3"  (1.6 m)   Wt 133 lb 3.2 oz (60.4 kg)   BMI 23.60 kg/m   Body mass index is 23.6 kg/m.   Tobacco Social History   Tobacco Use  Smoking Status Former Smoker  Smokeless Tobacco Never Used  Tobacco Comment   11/1998     Counseling given: Not Answered Comment: 11/1998   Past Medical History:  Diagnosis Date  . Hyperlipidemia   . Hypertension    Past Surgical History:  Procedure Laterality Date  . APPENDECTOMY    . BREAST BIOPSY Left 2002   EXCISIONAL - NEG  . CATARACT EXTRACTION, BILATERAL    . TOTAL HIP ARTHROPLASTY Right   . TOTAL HIP ARTHROPLASTY Left    Family History  Problem Relation Age of Onset  . Asthma Mother   . Hypertension Mother   . Atrial fibrillation Mother   . Osteoporosis Mother   . Ulcers Mother   . Diabetes Father   . Hypertension Father   . Heart disease Father   . Heart disease Brother   . Hypertension Brother   . Diabetes Brother   . Dementia Brother   . Diabetes Son   . Heart attack Son    Social History   Substance and Sexual Activity  Sexual Activity Not on file    Outpatient Encounter Medications as of 11/11/2017  Medication Sig  . amoxicillin (AMOXIL) 500 MG capsule Take 500 mg by mouth daily.  Marland Kitchen. aspirin 325 MG tablet Take 325 mg by mouth.   . bimatoprost (LUMIGAN) 0.01 % SOLN Place 1 drop into both eyes at bedtime.   . calcium carbonate (OS-CAL) 600 MG TABS tablet Take 1,200 mg by mouth daily.   Marland Kitchen. levothyroxine (SYNTHROID, LEVOTHROID) 50 MCG tablet TAKE 1 TABLET EVERY DAY  . loratadine (CLARITIN) 10 MG tablet Take 1 tablet (10 mg total)  by mouth daily. (Patient taking differently: Take 10 mg by mouth daily. )  . timolol (TIMOPTIC) 0.5 % ophthalmic solution Place 1 drop into both eyes daily.   . [DISCONTINUED] diltiazem (CARDIZEM LA) 120 MG 24 hr tablet Take by mouth.  . [DISCONTINUED] ranitidine (ZANTAC) 150 MG tablet Take 1 tablet (150 mg total) by mouth 2 (two) times daily. Just for 2 weeks only   No facility-administered encounter medications on file as of 11/11/2017.     Activities of Daily Living In your present state of health, do you have any difficulty performing the following activities: 11/11/2017  Hearing? Y  Comment wears bilateral hearing aids  Vision? N  Difficulty concentrating or making decisions? N  Walking or climbing stairs? Y  Comment due to hip pain  Dressing or bathing? N  Doing errands, shopping? N  Preparing Food and eating ? N  Using the Toilet? N  In the past six months, have you accidently leaked urine? N  Do you have problems with loss of bowel control? N  Managing your Medications? N  Managing your Finances? N  Housekeeping or managing your Housekeeping? N  Some recent data might be hidden    Patient Care Team: Julieanne MansonGilbert, Richard  Arma HeadingL Jr., MD as PCP - General (Family Medicine) Lockie MolaBrasington, Chadwick, MD as Referring Physician (Ophthalmology) Lamar BlinksKowalski, Bruce J, MD as Consulting Physician (Cardiology)    Assessment:     Exercise Activities and Dietary recommendations Current Exercise Habits: The patient does not participate in regular exercise at present, Exercise limited by: orthopedic condition(s)  Goals    . DIET - INCREASE WATER INTAKE     Recommend increasing of water intake to 4 glasses a day.       Fall Risk Fall Risk  11/11/2017 11/09/2016 11/06/2015  Falls in the past year? Yes Yes Yes  Number falls in past yr: 2 or more 1 1  Injury with Fall? No No No  Follow up Falls prevention discussed - -   Depression Screen PHQ 2/9 Scores 11/11/2017 11/09/2016 11/06/2015  PHQ -  2 Score 0 0 0     Cognitive Function: Pt declined screening today.        Immunization History  Administered Date(s) Administered  . Influenza-Unspecified 09/13/2013  . Pneumococcal Conjugate-13 10/15/2014  . Pneumococcal Polysaccharide-23 09/14/2007  . Td 07/30/2003  . Tdap 11/12/2014   Screening Tests Health Maintenance  Topic Date Due  . TETANUS/TDAP  11/12/2024  . INFLUENZA VACCINE  Completed  . DEXA SCAN  Completed  . PNA vac Low Risk Adult  Completed      Plan:  I have personally reviewed and addressed the Medicare Annual Wellness questionnaire and have noted the following in the patient's chart:  A. Medical and social history B. Use of alcohol, tobacco or illicit drugs  C. Current medications and supplements D. Functional ability and status E.  Nutritional status F.  Physical activity G. Advance directives H. List of other physicians I.  Hospitalizations, surgeries, and ER visits in previous 12 months J.  Vitals K. Screenings such as hearing and vision if needed, cognitive and depression L. Referrals and appointments - none  In addition, I have reviewed and discussed with patient certain preventive protocols, quality metrics, and best practice recommendations. A written personalized care plan for preventive services as well as general preventive health recommendations were provided to patient.  See attached scanned questionnaire for additional information.   Signed,  Hyacinth MeekerMckenzie Roberta Angell, LPN Nurse Health Advisor   MD Recommendations: None.

## 2017-11-11 NOTE — Patient Instructions (Signed)
[name] , Thank you for taking time to come for your Medicare Wellness Visit. I appreciate your ongoing commitment to your health goals. Please review the following plan we discussed and let me know if I can assist you in the future.   Screening recommendations/referrals: Colonoscopy: no longer required Mammogram: up to date Bone Density: up to date Recommended yearly ophthalmology/optometry visit for glaucoma screening and checkup Recommended yearly dental visit for hygiene and checkup  Vaccinations: Influenza vaccine: completed Pneumococcal vaccine: completed Tdap vaccine: up to date Shingles vaccine: declined  Advanced directives: Please bring a copy of your POA (Power of Attorney) and/or Living Will to your next appointment.   Conditions/risks identified: Recommend increasing of water intake to 4 glasses a day.   Next appointment: Pt declined setting up follow up at.   Preventive Care 3265 Years and Older, Female Preventive care refers to lifestyle choices and visits with your health care provider that can promote health and wellness. What does preventive care include?  A yearly physical exam. This is also called an annual well check.  Dental exams once or twice a year.  Routine eye exams. Ask your health care provider how often you should have your eyes checked.  Personal lifestyle choices, including:  Daily care of your teeth and gums.  Regular physical activity.  Eating a healthy diet.  Avoiding tobacco and drug use.  Limiting alcohol use.  Practicing safe sex.  Taking low-dose aspirin every day.  Taking vitamin and mineral supplements as recommended by your health care provider. What happens during an annual well check? The services and screenings done by your health care provider during your annual well check will depend on your age, overall health, lifestyle risk factors, and family history of disease. Counseling  Your health care provider may ask you  questions about your:  Alcohol use.  Tobacco use.  Drug use.  Emotional well-being.  Home and relationship well-being.  Sexual activity.  Eating habits.  History of falls.  Memory and ability to understand (cognition).  Work and work Astronomerenvironment.  Reproductive health. Screening  You may have the following tests or measurements:  Height, weight, and BMI.  Blood pressure.  Lipid and cholesterol levels. These may be checked every 5 years, or more frequently if you are over 81 years old.  Skin check.  Lung cancer screening. You may have this screening every year starting at age 81 if you have a 30-pack-year history of smoking and currently smoke or have quit within the past 15 years.  Fecal occult blood test (FOBT) of the stool. You may have this test every year starting at age 81.  Flexible sigmoidoscopy or colonoscopy. You may have a sigmoidoscopy every 5 years or a colonoscopy every 10 years starting at age 81.  Hepatitis C blood test.  Hepatitis B blood test.  Sexually transmitted disease (STD) testing.  Diabetes screening. This is done by checking your blood sugar (glucose) after you have not eaten for a while (fasting). You may have this done every 1-3 years.  Bone density scan. This is done to screen for osteoporosis. You may have this done starting at age 81.  Mammogram. This may be done every 1-2 years. Talk to your health care provider about how often you should have regular mammograms. Talk with your health care provider about your test results, treatment options, and if necessary, the need for more tests. Vaccines  Your health care provider may recommend certain vaccines, such as:  Influenza vaccine. This  is recommended every year.  Tetanus, diphtheria, and acellular pertussis (Tdap, Td) vaccine. You may need a Td booster every 10 years.  Zoster vaccine. You may need this after age 43.  Pneumococcal 13-valent conjugate (PCV13) vaccine. One dose is  recommended after age 69.  Pneumococcal polysaccharide (PPSV23) vaccine. One dose is recommended after age 58. Talk to your health care provider about which screenings and vaccines you need and how often you need them. This information is not intended to replace advice given to you by your health care provider. Make sure you discuss any questions you have with your health care provider. Document Released: 12/27/2015 Document Revised: 08/19/2016 Document Reviewed: 10/01/2015 Elsevier Interactive Patient Education  2017 [place] Prevention in the Home Falls can cause injuries. They can happen to people of all ages. There are many things you can do to make your home safe and to help prevent falls. What can I do on the outside of my home?  Regularly fix the edges of walkways and driveways and fix any cracks.  Remove anything that might make you trip as you walk through a door, such as a raised step or threshold.  Trim any bushes or trees on the path to your home.  Use bright outdoor lighting.  Clear any walking paths of anything that might make someone trip, such as rocks or tools.  Regularly check to see if handrails are loose or broken. Make sure that both sides of any steps have handrails.  Any raised decks and porches should have guardrails on the edges.  Have any leaves, snow, or ice cleared regularly.  Use sand or salt on walking paths during winter.  Clean up any spills in your garage right away. This includes oil or grease spills. What can I do in the bathroom?  Use night lights.  Install grab bars by the toilet and in the tub and shower. Do not use towel bars as grab bars.  Use non-skid mats or decals in the tub or shower.  If you need to sit down in the shower, use a plastic, non-slip stool.  Keep the floor dry. Clean up any water that spills on the floor as soon as it happens.  Remove soap buildup in the tub or shower regularly.  Attach bath mats  securely with double-sided non-slip rug tape.  Do not have throw rugs and other things on the floor that can make you trip. What can I do in the bedroom?  Use night lights.  Make sure that you have a light by your bed that is easy to reach.  Do not use any sheets or blankets that are too big for your bed. They should not hang down onto the floor.  Have a firm chair that has side arms. You can use this for support while you get dressed.  Do not have throw rugs and other things on the floor that can make you trip. What can I do in the kitchen?  Clean up any spills right away.  Avoid walking on wet floors.  Keep items that you use a lot in easy-to-reach places.  If you need to reach something above you, use a strong step stool that has a grab bar.  Keep electrical cords out of the way.  Do not use floor polish or wax that makes floors slippery. If you must use wax, use non-skid floor wax.  Do not have throw rugs and other things on the floor that can make you  trip. What can I do with my stairs?  Do not leave any items on the stairs.  Make sure that there are handrails on both sides of the stairs and use them. Fix handrails that are broken or loose. Make sure that handrails are as long as the stairways.  Check any carpeting to make sure that it is firmly attached to the stairs. Fix any carpet that is loose or worn.  Avoid having throw rugs at the top or bottom of the stairs. If you do have throw rugs, attach them to the floor with carpet tape.  Make sure that you have a light switch at the top of the stairs and the bottom of the stairs. If you do not have them, ask someone to add them for you. What else can I do to help prevent falls?  Wear shoes that:  Do not have high heels.  Have rubber bottoms.  Are comfortable and fit you well.  Are closed at the toe. Do not wear sandals.  If you use a stepladder:  Make sure that it is fully opened. Do not climb a closed  stepladder.  Make sure that both sides of the stepladder are locked into place.  Ask someone to hold it for you, if possible.  Clearly mark and make sure that you can see:  Any grab bars or handrails.  First and last steps.  Where the edge of each step is.  Use tools that help you move around (mobility aids) if they are needed. These include:  Canes.  Walkers.  Scooters.  Crutches.  Turn on the lights when you go into a dark area. Replace any light bulbs as soon as they burn out.  Set up your furniture so you have a clear path. Avoid moving your furniture around.  If any of your floors are uneven, fix them.  If there are any pets around you, be aware of where they are.  Review your medicines with your doctor. Some medicines can make you feel dizzy. This can increase your chance of falling. Ask your doctor what other things that you can do to help prevent falls. This information is not intended to replace advice given to you by your health care provider. Make sure you discuss any questions you have with your health care provider. Document Released: 09/26/2009 Document Revised: 05/07/2016 Document Reviewed: 01/04/2015 Elsevier Interactive Patient Education  2017 [employer].

## 2017-12-21 DIAGNOSIS — I482 Chronic atrial fibrillation: Secondary | ICD-10-CM | POA: Diagnosis not present

## 2017-12-21 DIAGNOSIS — Z950 Presence of cardiac pacemaker: Secondary | ICD-10-CM | POA: Diagnosis not present

## 2017-12-21 DIAGNOSIS — I495 Sick sinus syndrome: Secondary | ICD-10-CM | POA: Diagnosis not present

## 2018-03-08 DIAGNOSIS — H401131 Primary open-angle glaucoma, bilateral, mild stage: Secondary | ICD-10-CM | POA: Diagnosis not present

## 2018-03-15 DIAGNOSIS — H401131 Primary open-angle glaucoma, bilateral, mild stage: Secondary | ICD-10-CM | POA: Diagnosis not present

## 2018-05-03 ENCOUNTER — Ambulatory Visit (INDEPENDENT_AMBULATORY_CARE_PROVIDER_SITE_OTHER): Payer: Medicare Other | Admitting: Family Medicine

## 2018-05-03 VITALS — BP 142/94 | HR 78 | Temp 97.4°F | Resp 16 | Wt 131.0 lb

## 2018-05-03 DIAGNOSIS — E039 Hypothyroidism, unspecified: Secondary | ICD-10-CM | POA: Diagnosis not present

## 2018-05-03 DIAGNOSIS — Z950 Presence of cardiac pacemaker: Secondary | ICD-10-CM | POA: Diagnosis not present

## 2018-05-03 DIAGNOSIS — I495 Sick sinus syndrome: Secondary | ICD-10-CM | POA: Diagnosis not present

## 2018-05-03 DIAGNOSIS — E785 Hyperlipidemia, unspecified: Secondary | ICD-10-CM | POA: Diagnosis not present

## 2018-05-03 DIAGNOSIS — E559 Vitamin D deficiency, unspecified: Secondary | ICD-10-CM

## 2018-05-03 DIAGNOSIS — H409 Unspecified glaucoma: Secondary | ICD-10-CM

## 2018-05-03 DIAGNOSIS — I10 Essential (primary) hypertension: Secondary | ICD-10-CM | POA: Diagnosis not present

## 2018-05-03 DIAGNOSIS — I482 Chronic atrial fibrillation, unspecified: Secondary | ICD-10-CM

## 2018-05-03 NOTE — Progress Notes (Signed)
Savannah Tucker  MRN: [id number] DOB: December 12, 1930  Subjective:  HPI   Patient is an 84 year old female who presents for follow up of chronic health.  She was last seen on 11/11/17 for her annual Medicare wellness exam.  She feels well.  Hypertension BP Readings from Last 3 Encounters:  05/03/18 (!) 142/94  11/11/17 (!) 146/82  05/03/17 138/86   Hypothyroidism Lab Results  Component Value Date   TSH 2.060 11/09/2016   Hypercholesterolemia Lab Results  Component Value Date   CHOL 192 11/28/2013   HDL 57 11/28/2013   LDLCALC 112 11/28/2013   TRIG 116 11/28/2013   She has not had her labs checked since 2017.  Patient Active Problem List   Diagnosis Date Noted  . Arthritis 11/06/2015  . Alopecia 11/01/2015  . Chronic atrial fibrillation (HCC) 11/01/2015  . Decrease in the ability to hear 11/01/2015  . Glaucoma 11/01/2015  . HLD (hyperlipidemia) 11/01/2015  . Adult hypothyroidism 11/01/2015  . Osteopenia 11/01/2015  . Artificial cardiac pacemaker 11/01/2015  . Endocarditis 11/01/2015  . Avitaminosis D 11/01/2015  . Sick sinus syndrome (HCC) 12/31/2014  . Benign essential HTN 12/31/2014    Past Medical History:  Diagnosis Date  . Hyperlipidemia   . Hypertension     Social History   Socioeconomic History  . Marital status: Widowed    Spouse name: Not on file  . Number of children: Not on file  . Years of education: Not on file  . Highest education level: Not on file  Occupational History  . Not on file  Social Needs  . Financial resource strain: Not on file  . Food insecurity:    Worry: Not on file    Inability: Not on file  . Transportation needs:    Medical: Not on file    Non-medical: Not on file  Tobacco Use  . Smoking status: Former Games developer  . Smokeless tobacco: Never Used  . Tobacco comment: 11/1998  Substance and Sexual Activity  . Alcohol use: Yes    Alcohol/week: 4.2 - 8.4 oz    Types: 7 - 14 Glasses of wine per week  . Drug use: No  .  Sexual activity: Not on file  Lifestyle  . Physical activity:    Days per week: Not on file    Minutes per session: Not on file  . Stress: Not on file  Relationships  . Social connections:    Talks on phone: Not on file    Gets together: Not on file    Attends religious service: Not on file    Active member of club or organization: Not on file    Attends meetings of clubs or organizations: Not on file    Relationship status: Not on file  . Intimate partner violence:    Fear of current or ex partner: Not on file    Emotionally abused: Not on file    Physically abused: Not on file    Forced sexual activity: Not on file  Other Topics Concern  . Not on file  Social History Narrative  . Not on file    Outpatient Encounter Medications as of 05/03/2018  Medication Sig Note  . aspirin 325 MG tablet Take 325 mg by mouth.  11/01/2015: Received from: [facility]  . bimatoprost (LUMIGAN) 0.01 % SOLN Place 1 drop into both eyes at bedtime.  11/01/2015: Received from: [facility]  . calcium carbonate (OS-CAL) 600 MG TABS tablet Take 1,200 mg  by mouth daily.  11/01/2015: Received from: [facility]  . levothyroxine (SYNTHROID, LEVOTHROID) 50 MCG tablet TAKE 1 TABLET EVERY DAY   . loratadine (CLARITIN) 10 MG tablet Take 1 tablet (10 mg total) by mouth daily. (Patient taking differently: Take 10 mg by mouth daily. )   . timolol (TIMOPTIC) 0.5 % ophthalmic solution Place 1 drop into both eyes daily.  11/01/2015: Received from: [facility]  . [DISCONTINUED] amoxicillin (AMOXIL) 500 MG capsule Take 500 mg by mouth daily.    No facility-administered encounter medications on file as of 05/03/2018.     No Known Allergies  Review of Systems  Constitutional: Negative.   HENT: Negative.   Eyes: Negative.   Respiratory: Negative.   Cardiovascular: Negative.   Gastrointestinal: Negative.   Genitourinary: Negative.     Musculoskeletal: Positive for neck pain.  Skin: Negative.   Neurological: Negative.   Endo/Heme/Allergies: Negative.   Psychiatric/Behavioral: Negative.     Objective:  BP (!) 142/94 (BP Location: Right Arm, Patient Position: Sitting, Cuff Size: Normal)   Pulse 78   Temp (!) 97.4 F (36.3 C) (Oral)   Resp 16   Wt 131 lb (59.4 kg)   BMI 23.21 kg/m   Physical Exam  Constitutional: She is oriented to person, place, and time and well-developed, well-nourished, and in no distress.  HENT:  Head: Normocephalic and atraumatic.  Right Ear: External ear normal.  Left Ear: External ear normal.  Nose: Nose normal.  Mouth/Throat: Oropharynx is clear and moist.  Eyes: Conjunctivae are normal. No scleral icterus.  Neck: No thyromegaly present.  Cardiovascular: Normal rate, regular rhythm, normal heart sounds and intact distal pulses.  Pulmonary/Chest: Effort normal and breath sounds normal.  Abdominal: Soft.  Musculoskeletal: She exhibits no edema.  Lymphadenopathy:    She has no cervical adenopathy.  Neurological: She is alert and oriented to person, place, and time. Gait normal. GCS score is 15.  Skin: Skin is warm and dry.  Psychiatric: Mood, memory, affect and judgment normal.    Assessment and Plan :  Hypothyroid Pacemaker for sick sinus syndrome Afib HLD HTN  I have done the exam and reviewed the chart and it is accurate to the best of my knowledge. Dentist has been used and  any errors in dictation or transcription are unintentional. Julieanne Manson M.D. [facility]

## 2018-05-04 ENCOUNTER — Telehealth: Payer: Self-pay

## 2018-05-04 LAB — CBC WITH DIFFERENTIAL/PLATELET
Basophils Absolute: 0 10*3/uL (ref 0.0–0.2)
Basos: 1 %
EOS (ABSOLUTE): 0.2 10*3/uL (ref 0.0–0.4)
EOS: 3 %
HEMATOCRIT: 38.7 % (ref 34.0–46.6)
HEMOGLOBIN: 12.9 g/dL (ref 11.1–15.9)
Immature Grans (Abs): 0 10*3/uL (ref 0.0–0.1)
Immature Granulocytes: 0 %
Lymphocytes Absolute: 1.7 10*3/uL (ref 0.7–3.1)
Lymphs: 31 %
MCH: 32.8 pg (ref 26.6–33.0)
MCHC: 33.3 g/dL (ref 31.5–35.7)
MCV: 99 fL — ABNORMAL HIGH (ref 79–97)
MONOCYTES: 10 %
MONOS ABS: 0.5 10*3/uL (ref 0.1–0.9)
NEUTROS ABS: 2.9 10*3/uL (ref 1.4–7.0)
Neutrophils: 55 %
Platelets: 279 10*3/uL (ref 150–450)
RBC: 3.93 x10E6/uL (ref 3.77–5.28)
RDW: 13.6 % (ref 12.3–15.4)
WBC: 5.3 10*3/uL (ref 3.4–10.8)

## 2018-05-04 LAB — LIPID PANEL WITH LDL/HDL RATIO
Cholesterol, Total: 215 mg/dL — ABNORMAL HIGH (ref 100–199)
HDL: 96 mg/dL (ref >39)
LDL CALC: 103 mg/dL — AB (ref 0–99)
LDL/HDL RATIO: 1.1 ratio (ref 0.0–3.2)
Triglycerides: 78 mg/dL (ref 0–149)
VLDL Cholesterol Cal: 16 mg/dL (ref 5–40)

## 2018-05-04 LAB — COMPREHENSIVE METABOLIC PANEL
A/G RATIO: 1.8 (ref 1.2–2.2)
ALBUMIN: 4.5 g/dL (ref 3.5–4.7)
ALK PHOS: 60 IU/L (ref 39–117)
ALT: 14 IU/L (ref 0–32)
AST: 17 IU/L (ref 0–40)
BILIRUBIN TOTAL: 0.5 mg/dL (ref 0.0–1.2)
BUN / CREAT RATIO: 13 (ref 12–28)
BUN: 11 mg/dL (ref 8–27)
CHLORIDE: 98 mmol/L (ref 96–106)
CO2: 21 mmol/L (ref 20–29)
Calcium: 9.9 mg/dL (ref 8.7–10.3)
Creatinine, Ser: 0.84 mg/dL (ref 0.57–1.00)
GFR calc Af Amer: 73 mL/min/{1.73_m2} (ref >59)
GFR calc non Af Amer: 64 mL/min/{1.73_m2} (ref >59)
GLOBULIN, TOTAL: 2.5 g/dL (ref 1.5–4.5)
Glucose: 93 mg/dL (ref 65–99)
POTASSIUM: 4.3 mmol/L (ref 3.5–5.2)
SODIUM: 135 mmol/L (ref 134–144)
Total Protein: 7 g/dL (ref 6.0–8.5)

## 2018-05-04 LAB — TSH: TSH: 2.33 u[IU]/mL (ref 0.450–4.500)

## 2018-05-04 NOTE — Telephone Encounter (Signed)
[facility]  ED  ----- Message from Maple Hudson., MD sent at 05/04/2018  1:21 PM EDT ----- Labs OK

## 2018-05-04 NOTE — Telephone Encounter (Signed)
-----   Message from Maple Hudson., MD sent at 05/04/2018  1:21 PM EDT ----- Labs OK

## 2018-05-10 NOTE — Telephone Encounter (Signed)
Advised patient of results.  

## 2018-05-17 ENCOUNTER — Other Ambulatory Visit: Payer: Self-pay | Admitting: Family Medicine

## 2018-06-08 DIAGNOSIS — I495 Sick sinus syndrome: Secondary | ICD-10-CM | POA: Diagnosis not present

## 2018-06-08 DIAGNOSIS — I482 Chronic atrial fibrillation: Secondary | ICD-10-CM | POA: Diagnosis not present

## 2018-06-08 DIAGNOSIS — Z950 Presence of cardiac pacemaker: Secondary | ICD-10-CM | POA: Diagnosis not present

## 2018-06-08 DIAGNOSIS — I38 Endocarditis, valve unspecified: Secondary | ICD-10-CM | POA: Diagnosis not present

## 2018-06-08 DIAGNOSIS — I10 Essential (primary) hypertension: Secondary | ICD-10-CM | POA: Diagnosis not present

## 2018-09-12 DIAGNOSIS — H401131 Primary open-angle glaucoma, bilateral, mild stage: Secondary | ICD-10-CM | POA: Diagnosis not present

## 2018-10-14 DIAGNOSIS — Z23 Encounter for immunization: Secondary | ICD-10-CM | POA: Diagnosis not present

## 2018-10-18 ENCOUNTER — Telehealth: Payer: Self-pay

## 2018-10-18 NOTE — Telephone Encounter (Signed)
LMTCB and schedule AWV. Apt needs to be after 11/11/18. -MM

## 2018-11-03 ENCOUNTER — Ambulatory Visit (INDEPENDENT_AMBULATORY_CARE_PROVIDER_SITE_OTHER): Payer: Medicare Other | Admitting: Family Medicine

## 2018-11-03 VITALS — BP 165/97 | HR 88 | Temp 97.7°F | Resp 16 | Wt 130.0 lb

## 2018-11-03 DIAGNOSIS — I482 Chronic atrial fibrillation, unspecified: Secondary | ICD-10-CM | POA: Diagnosis not present

## 2018-11-03 DIAGNOSIS — E039 Hypothyroidism, unspecified: Secondary | ICD-10-CM | POA: Diagnosis not present

## 2018-11-03 DIAGNOSIS — I10 Essential (primary) hypertension: Secondary | ICD-10-CM

## 2018-11-03 MED ORDER — METOPROLOL SUCCINATE ER 25 MG PO TB24: 12.5000 mg | ORAL_TABLET | Freq: Every day | ORAL | 12 refills | Status: DC

## 2018-11-03 NOTE — Progress Notes (Signed)
Savannah Tucker  MRN: [id number] DOB: 1929-12-31  Subjective:  HPI   The patient is an 84 year old female who presents today for follow up of chronic health.  The patient was last seen for this on 05/03/18. She has no complaints. Hypothyroid-labs done on last visit. Level stable.  Lab Results  Component Value Date   TSH 2.330 05/03/2018   Hypertension-blood pressure elevated on last few visits.   BP Readings from Last 3 Encounters:  11/03/18 (!) 165/97  05/03/18 (!) 142/94  11/11/17 (!) 146/82      Patient Active Problem List   Diagnosis Date Noted  . Arthritis 11/06/2015  . Alopecia 11/01/2015  . Chronic atrial fibrillation 11/01/2015  . Decrease in the ability to hear 11/01/2015  . Glaucoma 11/01/2015  . HLD (hyperlipidemia) 11/01/2015  . Adult hypothyroidism 11/01/2015  . Osteopenia 11/01/2015  . Artificial cardiac pacemaker 11/01/2015  . Endocarditis 11/01/2015  . Avitaminosis D 11/01/2015  . Sick sinus syndrome (HCC) 12/31/2014  . Benign essential HTN 12/31/2014    Past Medical History:  Diagnosis Date  . Hyperlipidemia   . Hypertension     Social History   Socioeconomic History  . Marital status: Widowed    Spouse name: Not on file  . Number of children: Not on file  . Years of education: Not on file  . Highest education level: Not on file  Occupational History  . Not on file  Social Needs  . Financial resource strain: Not on file  . Food insecurity:    Worry: Not on file    Inability: Not on file  . Transportation needs:    Medical: Not on file    Non-medical: Not on file  Tobacco Use  . Smoking status: Former Games developer  . Smokeless tobacco: Never Used  . Tobacco comment: 11/1998  Substance and Sexual Activity  . Alcohol use: Yes    Alcohol/week: 7.0 - 14.0 standard drinks    Types: 7 - 14 Glasses of wine per week  . Drug use: No  . Sexual activity: Not on file  Lifestyle  . Physical activity:    Days per week: Not on file    Minutes  per session: Not on file  . Stress: Not on file  Relationships  . Social connections:    Talks on phone: Not on file    Gets together: Not on file    Attends religious service: Not on file    Active member of club or organization: Not on file    Attends meetings of clubs or organizations: Not on file    Relationship status: Not on file  . Intimate partner violence:    Fear of current or ex partner: Not on file    Emotionally abused: Not on file    Physically abused: Not on file    Forced sexual activity: Not on file  Other Topics Concern  . Not on file  Social History Narrative  . Not on file    Outpatient Encounter Medications as of 11/03/2018  Medication Sig Note  . aspirin 325 MG tablet Take 325 mg by mouth.  11/01/2015: Received from: [facility]  . bimatoprost (LUMIGAN) 0.01 % SOLN Place 1 drop into both eyes at bedtime.  11/01/2015: Received from: [facility]  . calcium carbonate (OS-CAL) 600 MG TABS tablet Take 1,200 mg by mouth daily.  11/01/2015: Received from: [facility]  . levothyroxine (SYNTHROID, LEVOTHROID) 50 MCG tablet TAKE 1 TABLET DAILY   .  loratadine (CLARITIN) 10 MG tablet Take 1 tablet (10 mg total) by mouth daily. (Patient taking differently: Take 10 mg by mouth daily. )   . timolol (TIMOPTIC) 0.5 % ophthalmic solution Place 1 drop into both eyes daily.  11[facility]01[facility]2015: [facility]   No facility-administered encounter medications on file as of 11/03/2018.     No Known Allergies  Review of Systems  Constitutional: Negative for fever and malaise/fatigue.  HENT: Negative.   Eyes: Negative.   Respiratory: Negative for cough, shortness of breath and wheezing.   Cardiovascular: Negative for chest pain, palpitations, orthopnea, claudication and leg swelling.  Gastrointestinal: Negative.   Musculoskeletal: Negative.   Skin: Negative.   Neurological: Negative.     Endo/Heme/Allergies: Negative.   Psychiatric/Behavioral: Negative.     Objective:  BP (!) 165/97 (BP Location: Right Arm, Patient Position: Sitting, Cuff Size: Normal)   Pulse 88   Temp 97.7 F (36.5 C) (Oral)   Resp 16   Wt 130 lb (59 kg)   BMI 23.03 kg/m   Physical Exam  Constitutional: She is oriented to person, place, and time and well-developed, well-nourished, and in no distress.  Thin WF  Appears much younger than her age.  HENT:  Head: Normocephalic and atraumatic.  Eyes: Conjunctivae are normal.  Neck: No thyromegaly present.  Cardiovascular: Normal rate, regular rhythm and normal heart sounds.  Pulmonary/Chest: Effort normal and breath sounds normal.  Abdominal: Soft.  Musculoskeletal: She exhibits no edema.  Neurological: She is alert and oriented to person, place, and time. Gait normal. GCS score is 15.  Skin: Skin is warm and dry.  Psychiatric: Mood, memory, affect and judgment normal.    Assessment and Plan :   1. Benign essential HTN Start Toprol XL 12.5 daily--RTC 1-2 months.  2. Chronic atrial fibrillation   3. Adult hypothyroidism   I have done the exam and reviewed the chart and it is accurate to the best of my knowledge. DentistDragon  technology has been used and  any errors in dictation or transcription are unintentional. Julieanne Mansonichard Kmari Halter M.D. [facility]   HPI, Exam and A&P Transcribed under the direction and in the presence of Megan Mansichard Naelle Diegel, Jr., MD. Electronically Signed: Janey GreaserElena DeSanto, RMA

## 2018-11-15 DIAGNOSIS — I495 Sick sinus syndrome: Secondary | ICD-10-CM | POA: Diagnosis not present

## 2018-11-23 NOTE — Telephone Encounter (Signed)
This encounter was created in error - please disregard.

## 2018-11-25 ENCOUNTER — Encounter: Payer: Self-pay | Admitting: Family Medicine

## 2018-11-25 ENCOUNTER — Ambulatory Visit (INDEPENDENT_AMBULATORY_CARE_PROVIDER_SITE_OTHER): Payer: Medicare Other | Admitting: Family Medicine

## 2018-11-25 VITALS — BP 166/93 | HR 96 | Temp 97.6°F | Wt 128.4 lb

## 2018-11-25 DIAGNOSIS — I10 Essential (primary) hypertension: Secondary | ICD-10-CM | POA: Diagnosis not present

## 2018-11-25 DIAGNOSIS — J0140 Acute pansinusitis, unspecified: Secondary | ICD-10-CM

## 2018-11-25 MED ORDER — BENZONATATE 100 MG PO CAPS: 100.0000 mg | ORAL_CAPSULE | Freq: Two times a day (BID) | ORAL | 0 refills | Status: DC | PRN

## 2018-11-25 MED ORDER — AMLODIPINE BESYLATE 5 MG PO TABS: 5.0000 mg | ORAL_TABLET | Freq: Every day | ORAL | 3 refills | Status: DC

## 2018-11-25 MED ORDER — DOXYCYCLINE HYCLATE 100 MG PO TABS: 100.0000 mg | ORAL_TABLET | Freq: Two times a day (BID) | ORAL | 0 refills | Status: AC

## 2018-11-25 NOTE — Patient Instructions (Signed)

## 2018-11-25 NOTE — Progress Notes (Signed)
Patient: Savannah Tucker Female    DOB: Mar 06, 1930   84 y.o.   MRN: [id number] Visit Date: 11/25/2018  Today's Provider: Shirlee Latch, MD   Chief Complaint  Patient presents with  . URI   Subjective:     I, Presley Raddle, CMA, am acting as a scribe for Shirlee Latch, MD.  URI   This is a new problem. Episode onset: 8 days ago. The problem has been unchanged. There has been no fever. Associated symptoms include congestion, coughing, rhinorrhea and a sore throat. Treatments tried: Mucinex and Tylenol. The treatment provided no relief.   Post tussive emesis x2.  Tried honey and tea as well.  Seems to have a lot of post-nasal drainage.   HTN: Started on low dose Toprol by PCP on 11/03/2017.  She had an episode of syncope shortly thereafter and her Toprol was stopped.  Her pacemaker was interrogated at that time and she was told there was no abnormality.  She is feeling well now that she is off metoprolol.  Her daughter-in-law has been monitoring her blood pressure at home and it remains elevated above 150 systolic consistently.  She has never taken another blood pressure medication.  She has a follow-up with Dr. Gwen Pounds early next month.  And to follow-up with her PCP and a little over a month.   No Known Allergies   Current Outpatient Medications:  .  aspirin 325 MG tablet, Take 325 mg by mouth. , Disp: , Rfl:  .  bimatoprost (LUMIGAN) 0.01 % SOLN, Place 1 drop into both eyes at bedtime. , Disp: , Rfl:  .  calcium carbonate (OS-CAL) 600 MG TABS tablet, Take 1,200 mg by mouth daily. , Disp: , Rfl:  .  levothyroxine (SYNTHROID, LEVOTHROID) 50 MCG tablet, TAKE 1 TABLET DAILY, Disp: 90 tablet, Rfl: 3 .  loratadine (CLARITIN) 10 MG tablet, Take 1 tablet (10 mg total) by mouth daily. (Patient taking differently: Take 10 mg by mouth daily. ), Disp: 30 tablet, Rfl: 2 .  timolol (TIMOPTIC) 0.5 % ophthalmic solution, Place 1 drop into both eyes daily. , Disp: , Rfl:  .   amLODipine (NORVASC) 5 MG tablet, Take 1 tablet (5 mg total) by mouth daily., Disp: 30 tablet, Rfl: 3 .  benzonatate (TESSALON) 100 MG capsule, Take 1 capsule (100 mg total) by mouth 2 (two) times daily as needed for cough., Disp: 45 capsule, Rfl: 0 .  doxycycline (VIBRA-TABS) 100 MG tablet, Take 1 tablet (100 mg total) by mouth 2 (two) times daily for 7 days., Disp: 14 tablet, Rfl: 0  Review of Systems  Constitutional: Positive for appetite change and chills.  HENT: Positive for congestion, rhinorrhea and sore throat.   Respiratory: Positive for cough.   Cardiovascular: Negative.   Musculoskeletal: Negative.     Social History   Tobacco Use  . Smoking status: Former Games developer  . Smokeless tobacco: Never Used  . Tobacco comment: 11/1998  Substance Use Topics  . Alcohol use: Yes    Alcohol/week: 7.0 - 14.0 standard drinks    Types: 7 - 14 Glasses of wine per week      Objective:   BP (!) 166/93 (BP Location: Right Arm, Patient Position: Sitting, Cuff Size: Normal)   Pulse 96   Temp 97.6 F (36.4 C) (Oral)   Wt 128 lb 6.4 oz (58.2 kg)   SpO2 97%   BMI 22.75 kg/m  Vitals:   11/25/18 1403  BP: (!) 166/93  Pulse: 96  Temp: 97.6 F (36.4 C)  TempSrc: Oral  SpO2: 97%  Weight: 128 lb 6.4 oz (58.2 kg)     Physical Exam Vitals signs reviewed.  Constitutional:      General: She is not in acute distress.    Appearance: Normal appearance. She is not diaphoretic.     Comments: Thin, appears younger than stated age  HENT:     Head: Normocephalic and atraumatic.     Right Ear: Tympanic membrane, ear canal and external ear normal.     Left Ear: Tympanic membrane, ear canal and external ear normal.     Nose: Congestion and rhinorrhea present.     Right Sinus: Maxillary sinus tenderness and frontal sinus tenderness present.     Left Sinus: Maxillary sinus tenderness and frontal sinus tenderness present.     Mouth/Throat:     Mouth: Mucous membranes are moist.     Pharynx:  Oropharynx is clear. No oropharyngeal exudate or posterior oropharyngeal erythema.  Eyes:     General: No scleral icterus.       Right eye: No discharge.        Left eye: No discharge.     Extraocular Movements: Extraocular movements intact.     Conjunctiva/sclera: Conjunctivae normal.     Pupils: Pupils are equal, round, and reactive to light.  Neck:     Musculoskeletal: Neck supple.  Cardiovascular:     Rate and Rhythm: Normal rate and regular rhythm.     Heart sounds: Normal heart sounds. No murmur.  Pulmonary:     Effort: Pulmonary effort is normal. No respiratory distress.     Breath sounds: Normal breath sounds. No wheezing or rhonchi.  Abdominal:     General: There is no distension.     Palpations: Abdomen is soft.     Tenderness: There is no abdominal tenderness.  Musculoskeletal:     Right lower leg: No edema.     Left lower leg: No edema.  Lymphadenopathy:     Cervical: No cervical adenopathy.  Skin:    General: Skin is warm and dry.     Capillary Refill: Capillary refill takes less than 2 seconds.     Findings: No rash.  Neurological:     General: No focal deficit present.     Mental Status: She is alert and oriented to person, place, and time.  Psychiatric:        Mood and Affect: Mood normal.        Behavior: Behavior normal.        Assessment & Plan   1. Acute non-recurrent pansinusitis - symptoms and exam c/w sinusitis   - no evidence of AOM, CAP, strep pharyngitis, or other infection - given duration of symptoms, suspect bacterial etiology - will treat with doxycycline x7d - discussed symptomatic management (flonase, etc), natural course, and return precautions  - cough is likely 2/2 drainage - can use tessalon prn   2. Benign essential HTN - uncontrolled - did not tolerate Toprol due to sick sinus syndrome - will avoid beta blockers - can try amlodipine 5 mg daily - continue to monitor home BPs - keep f/u appts as already scheduled.    Meds  ordered this encounter  Medications  . doxycycline (VIBRA-TABS) 100 MG tablet    Sig: Take 1 tablet (100 mg total) by mouth 2 (two) times daily for 7 days.    Dispense:  14 tablet    Refill:  0  . benzonatate (  TESSALON) 100 MG capsule    Sig: Take 1 capsule (100 mg total) by mouth 2 (two) times daily as needed for cough.    Dispense:  45 capsule    Refill:  0  . amLODipine (NORVASC) 5 MG tablet    Sig: Take 1 tablet (5 mg total) by mouth daily.    Dispense:  30 tablet    Refill:  3     Return if symptoms worsen or fail to improve.   The entirety of the information documented in the History of Present Illness, Review of Systems and Physical Exam were personally obtained by me. Portions of this information were initially documented by Presley RaddleNikki Walston, CMA and reviewed by me for thoroughness and accuracy.    Erasmo DownerBacigalupo, Glennice Marcos M, MD, MPH [facility] 11/25/2018 2:29 PM

## 2018-12-15 DIAGNOSIS — R42 Dizziness and giddiness: Secondary | ICD-10-CM | POA: Insufficient documentation

## 2018-12-15 DIAGNOSIS — I482 Chronic atrial fibrillation, unspecified: Secondary | ICD-10-CM | POA: Diagnosis not present

## 2018-12-15 DIAGNOSIS — I10 Essential (primary) hypertension: Secondary | ICD-10-CM | POA: Diagnosis not present

## 2018-12-15 DIAGNOSIS — I38 Endocarditis, valve unspecified: Secondary | ICD-10-CM | POA: Diagnosis not present

## 2018-12-19 NOTE — Telephone Encounter (Signed)
Pt states she goes to the doctors all the time and she wants to skip the AWV this year. FYI to PCP! -MM

## 2019-01-04 ENCOUNTER — Encounter: Payer: Self-pay | Admitting: Family Medicine

## 2019-01-04 ENCOUNTER — Ambulatory Visit (INDEPENDENT_AMBULATORY_CARE_PROVIDER_SITE_OTHER): Payer: Medicare Other | Admitting: Family Medicine

## 2019-01-04 VITALS — BP 138/84 | HR 74 | Temp 97.5°F | Resp 16 | Ht 63.0 in | Wt 125.0 lb

## 2019-01-04 DIAGNOSIS — I495 Sick sinus syndrome: Secondary | ICD-10-CM

## 2019-01-04 DIAGNOSIS — E039 Hypothyroidism, unspecified: Secondary | ICD-10-CM

## 2019-01-04 DIAGNOSIS — I482 Chronic atrial fibrillation, unspecified: Secondary | ICD-10-CM | POA: Diagnosis not present

## 2019-01-04 DIAGNOSIS — I10 Essential (primary) hypertension: Secondary | ICD-10-CM

## 2019-01-04 DIAGNOSIS — Z950 Presence of cardiac pacemaker: Secondary | ICD-10-CM | POA: Diagnosis not present

## 2019-01-04 NOTE — Progress Notes (Signed)
Patient: Savannah Tucker Female    DOB: 1930-01-06   85 y.o.   MRN: [id number] Visit Date: 01/04/2019  Today's Provider: Megan Mans, MD   Chief Complaint  Patient presents with  . Hypertension   Subjective:     HPI Patient comes in today for a follow up. She was last seen for this 2 months ago.  Patient is a very young 85 year old.  Hypertension- She was started back on Toprol XL 12.5mg  daily. Patient reports that she only took the medication for approx 1 week, and stopped due to severe dizziness. She also mentions having a fall as a result of her dizzy spells. Patient had a f/u appt with Dr. Gwen Pounds 2 weeks ago, and reports that he recommended patient stay off of Toprol for now. She checks her BP at home, and it has averaged in the high 130s/80s.  BP Readings from Last 3 Encounters:  01/04/19 138/84  11/25/18 (!) 166/93  11/03/18 (!) 165/97    Hypothyroidism- Patient is currently taking levothyroxine daily. No medications were changed since last time. She reports good compliance, and good symptom control. Lab Results  Component Value Date   TSH 2.330 05/03/2018    No Known Allergies   Current Outpatient Medications:  .  amLODipine (NORVASC) 5 MG tablet, Take 1 tablet (5 mg total) by mouth daily., Disp: 30 tablet, Rfl: 3 .  aspirin 325 MG tablet, Take 325 mg by mouth. , Disp: , Rfl:  .  bimatoprost (LUMIGAN) 0.01 % SOLN, Place 1 drop into both eyes at bedtime. , Disp: , Rfl:  .  calcium carbonate (OS-CAL) 600 MG TABS tablet, Take 1,200 mg by mouth daily. , Disp: , Rfl:  .  levothyroxine (SYNTHROID, LEVOTHROID) 50 MCG tablet, TAKE 1 TABLET DAILY, Disp: 90 tablet, Rfl: 3 .  loratadine (CLARITIN) 10 MG tablet, Take 1 tablet (10 mg total) by mouth daily. (Patient taking differently: Take 10 mg by mouth daily. ), Disp: 30 tablet, Rfl: 2 .  timolol (TIMOPTIC) 0.5 % ophthalmic solution, Place 1 drop into both eyes daily. , Disp: , Rfl:  .  benzonatate  (TESSALON) 100 MG capsule, Take 1 capsule (100 mg total) by mouth 2 (two) times daily as needed for cough. (Patient not taking: Reported on 01/04/2019), Disp: 45 capsule, Rfl: 0  Review of Systems  Constitutional: Negative.   Eyes: Negative.   Respiratory: Negative for cough and shortness of breath.   Cardiovascular: Negative for chest pain, palpitations and leg swelling.  Gastrointestinal: Negative.   Endocrine: Negative for cold intolerance, heat intolerance, polydipsia, polyphagia and polyuria.  Musculoskeletal: Negative for arthralgias.  Allergic/Immunologic: Negative.   Neurological: Negative for dizziness, weakness, light-headedness and headaches.  Hematological: Negative.   Psychiatric/Behavioral: Negative.     Social History   Tobacco Use  . Smoking status: Former Games developer  . Smokeless tobacco: Never Used  . Tobacco comment: 11/1998  Substance Use Topics  . Alcohol use: Yes    Alcohol/week: 7.0 - 14.0 standard drinks    Types: 7 - 14 Glasses of wine per week      Objective:   BP 138/84 (BP Location: Left Arm, Patient Position: Sitting, Cuff Size: Normal)   Pulse 74   Temp (!) 97.5 F (36.4 C)   Resp 16   Ht 5\' 3"  (1.6 m)   Wt 125 lb (56.7 kg)   BMI 22.14 kg/m  Vitals:   01/04/19 1431  BP: 138/84  Pulse: 74  Resp: 16  Temp: (!) 97.5 F (36.4 C)  Weight: 125 lb (56.7 kg)  Height: 5\' 3"  (1.6 m)      Physical Exam Constitutional:      Appearance: Normal appearance.  HENT:     Head: Normocephalic and atraumatic.     Right Ear: External ear normal.     Left Ear: External ear normal.     Nose: Nose normal.     Mouth/Throat:     Pharynx: Oropharynx is clear.  Eyes:     General: No scleral icterus.    Conjunctiva/sclera: Conjunctivae normal.  Cardiovascular:     Rate and Rhythm: Normal rate and regular rhythm.     Heart sounds: Normal heart sounds.  Pulmonary:     Effort: Pulmonary effort is normal.     Breath sounds: Normal breath sounds.  Abdominal:      Palpations: Abdomen is soft.  Musculoskeletal:     Right lower leg: No edema.     Left lower leg: No edema.  Skin:    General: Skin is warm and dry.  Neurological:     General: No focal deficit present.     Mental Status: She is alert and oriented to person, place, and time. Mental status is at baseline.  Psychiatric:        Mood and Affect: Mood normal.        Behavior: Behavior normal.        Thought Content: Thought content normal.        Judgment: Judgment normal.         Assessment & Plan    1. Benign essential HTN Patient to stay off of Toprol until follow-up with cardiology.  2. Adult hypothyroidism Follow-up TSH on her next visit.  3. Chronic atrial fibrillation All issues appear stable.  Patient declines anticoagulation  4. Sick sinus syndrome (HCC)   5. Artificial cardiac pacemaker     I have done the exam and reviewed the above chart and it is accurate to the best of my knowledge. Dentist has been used in this note in any air is in the dictation or transcription are unintentional.  Megan Mans, MD  [facility]

## 2019-06-06 ENCOUNTER — Ambulatory Visit (INDEPENDENT_AMBULATORY_CARE_PROVIDER_SITE_OTHER): Payer: Medicare Other | Admitting: Family Medicine

## 2019-06-06 ENCOUNTER — Other Ambulatory Visit: Payer: Self-pay

## 2019-06-06 VITALS — BP 132/70 | HR 84 | Temp 98.7°F | Resp 16 | Ht 63.0 in | Wt 130.0 lb

## 2019-06-06 DIAGNOSIS — I482 Chronic atrial fibrillation, unspecified: Secondary | ICD-10-CM | POA: Diagnosis not present

## 2019-06-06 DIAGNOSIS — E039 Hypothyroidism, unspecified: Secondary | ICD-10-CM

## 2019-06-06 DIAGNOSIS — I10 Essential (primary) hypertension: Secondary | ICD-10-CM

## 2019-06-06 NOTE — Progress Notes (Signed)
Patient: Savannah Tucker Female    DOB: [birth year]   85 y.o.   MRN: [id number] Visit Date: 06/06/2019  Today's Provider: Wilhemena Durie, MD   Chief Complaint  Patient presents with  . Hypertension   Subjective:    HPI  Hypertension, follow-up:  BP Readings from Last 3 Encounters:  06/06/19 132/70  01/04/19 138/84  11/25/18 (!) 166/93    She was last seen for hypertension 6 months ago.  BP at that visit was 138/84. Management since that visit includes no changes. She reports good compliance with treatment. She is not having side effects.  She is exercising. She is adherent to low salt diet.   Outside blood pressures are checked daily. She is experiencing none.  Patient denies exertional chest pressure/discomfort, lower extremity edema and palpitations.    Weight trend: stable Wt Readings from Last 3 Encounters:  06/06/19 130 lb (59 kg)  01/04/19 125 lb (56.7 kg)  11/25/18 128 lb 6.4 oz (58.2 kg)    Hypothyroidism, follow up: Patient was last seen in the office 6 months ago. No changes were made.  Lab Results  Component Value Date   TSH 2.330 05/03/2018    No Known Allergies   Current Outpatient Medications:  .  amLODipine (NORVASC) 5 MG tablet, Take 1 tablet (5 mg total) by mouth daily., Disp: 30 tablet, Rfl: 3 .  aspirin 325 MG tablet, Take 325 mg by mouth. , Disp: , Rfl:  .  bimatoprost (LUMIGAN) 0.01 % SOLN, Place 1 drop into both eyes at bedtime. , Disp: , Rfl:  .  calcium carbonate (OS-CAL) 600 MG TABS tablet, Take 1,200 mg by mouth daily. , Disp: , Rfl:  .  levothyroxine (SYNTHROID, LEVOTHROID) 50 MCG tablet, TAKE 1 TABLET DAILY, Disp: 90 tablet, Rfl: 3 .  timolol (TIMOPTIC) 0.5 % ophthalmic solution, Place 1 drop into both eyes daily. , Disp: , Rfl:  .  benzonatate (TESSALON) 100 MG capsule, Take 1 capsule (100 mg total) by mouth 2 (two) times daily as needed for cough. (Patient not taking: Reported on 01/04/2019), Disp: 45 capsule, Rfl: 0 .   loratadine (CLARITIN) 10 MG tablet, Take 1 tablet (10 mg total) by mouth daily. (Patient not taking: Reported on 06/06/2019), Disp: 30 tablet, Rfl: 2  Review of Systems  Constitutional: Negative.   Eyes: Negative.   Respiratory: Negative for cough and shortness of breath.   Cardiovascular: Negative for chest pain, palpitations and leg swelling.  Gastrointestinal: Negative.   Endocrine: Negative for cold intolerance, heat intolerance, polydipsia, polyphagia and polyuria.  Musculoskeletal: Negative for arthralgias.  Allergic/Immunologic: Negative.   Neurological: Negative for dizziness, weakness, light-headedness and headaches.  Hematological: Negative.   Psychiatric/Behavioral: Negative.   All other systems reviewed and are negative.   Social History   Tobacco Use  . Smoking status: Former Research scientist (life sciences)  . Smokeless tobacco: Never Used  . Tobacco comment: 11/1998  Substance Use Topics  . Alcohol use: Yes    Alcohol/week: 7.0 - 14.0 standard drinks    Types: 7 - 14 Glasses of wine per week      Objective:   BP 132/70   Pulse 84 Comment: irregular  Temp 98.7 F (37.1 C)   Resp 16   Ht 5\' 3"  (1.6 m)   Wt 130 lb (59 kg)   BMI 23.03 kg/m  Vitals:   06/06/19 1348  BP: 132/70  Pulse: 84  Resp: 16  Temp: 98.7 F (37.1 C)  Weight:  130 lb (59 kg)  Height: 5\' 3"  (1.6 m)     Physical Exam Vitals signs reviewed.  Constitutional:      Appearance: Normal appearance.  HENT:     Head: Normocephalic and atraumatic.     Right Ear: External ear normal.     Left Ear: External ear normal.     Nose: Nose normal.     Mouth/Throat:     Pharynx: Oropharynx is clear.  Eyes:     General: No scleral icterus.    Conjunctiva/sclera: Conjunctivae normal.  Cardiovascular:     Rate and Rhythm: Normal rate and regular rhythm.     Heart sounds: Normal heart sounds.  Pulmonary:     Effort: Pulmonary effort is normal.     Breath sounds: Normal breath sounds.  Abdominal:     Palpations:  Abdomen is soft.  Musculoskeletal:     Right lower leg: No edema.     Left lower leg: No edema.  Skin:    General: Skin is warm and dry.  Neurological:     General: No focal deficit present.     Mental Status: She is alert and oriented to person, place, and time. Mental status is at baseline.  Psychiatric:        Mood and Affect: Mood normal.        Behavior: Behavior normal.        Thought Content: Thought content normal.        Judgment: Judgment normal.      No results found for any visits on 06/06/19.     Assessment & Plan    1. Benign essential HTN Controlled. - Comprehensive metabolic panel  2. Adult hypothyroidism  - TSH  3. Chronic atrial fibrillation No anticoagulation per cardiology - CBC with Differential/Platelet    I have done the exam and reviewed the above chart and it is accurate to the best of my knowledge. [employer]  technology has been used in this note in any air is in the dictation or transcription are unintentional.  Megan Mansichard Gilbert Jr, MD  [facility]

## 2019-06-07 LAB — COMPREHENSIVE METABOLIC PANEL
ALT: 15 IU/L (ref 0–32)
AST: 19 IU/L (ref 0–40)
Albumin/Globulin Ratio: 1.8 (ref 1.2–2.2)
Albumin: 4.4 g/dL (ref 3.6–4.6)
Alkaline Phosphatase: 65 IU/L (ref 39–117)
BUN/Creatinine Ratio: 12 (ref 12–28)
BUN: 11 mg/dL (ref 8–27)
Bilirubin Total: 0.6 mg/dL (ref 0.0–1.2)
CO2: 22 mmol/L (ref 20–29)
Calcium: 9.8 mg/dL (ref 8.7–10.3)
Chloride: 96 mmol/L (ref 96–106)
Creatinine, Ser: 0.89 mg/dL (ref 0.57–1.00)
GFR calc Af Amer: 68 mL/min/{1.73_m2} (ref >59)
GFR calc non Af Amer: 59 mL/min/{1.73_m2} — ABNORMAL LOW (ref >59)
Globulin, Total: 2.4 g/dL (ref 1.5–4.5)
Glucose: 91 mg/dL (ref 65–99)
Potassium: 4.3 mmol/L (ref 3.5–5.2)
Sodium: 135 mmol/L (ref 134–144)
Total Protein: 6.8 g/dL (ref 6.0–8.5)

## 2019-06-07 LAB — CBC WITH DIFFERENTIAL/PLATELET
Basophils Absolute: 0.1 10*3/uL (ref 0.0–0.2)
Basos: 1 %
EOS (ABSOLUTE): 0.1 10*3/uL (ref 0.0–0.4)
Eos: 3 %
Hematocrit: 38.2 % (ref 34.0–46.6)
Hemoglobin: 12.8 g/dL (ref 11.1–15.9)
Immature Grans (Abs): 0 10*3/uL (ref 0.0–0.1)
Immature Granulocytes: 0 %
Lymphocytes Absolute: 1.8 10*3/uL (ref 0.7–3.1)
Lymphs: 34 %
MCH: 32.9 pg (ref 26.6–33.0)
MCHC: 33.5 g/dL (ref 31.5–35.7)
MCV: 98 fL — ABNORMAL HIGH (ref 79–97)
Monocytes Absolute: 0.4 10*3/uL (ref 0.1–0.9)
Monocytes: 7 %
Neutrophils Absolute: 2.8 10*3/uL (ref 1.4–7.0)
Neutrophils: 55 %
Platelets: 285 10*3/uL (ref 150–450)
RBC: 3.89 x10E6/uL (ref 3.77–5.28)
RDW: 12.4 % (ref 11.7–15.4)
WBC: 5.1 10*3/uL (ref 3.4–10.8)

## 2019-06-07 LAB — TSH: TSH: 2.25 u[IU]/mL (ref 0.450–4.500)

## 2019-06-08 ENCOUNTER — Telehealth: Payer: Self-pay

## 2019-06-08 NOTE — Telephone Encounter (Signed)
[id number] 

## 2019-06-08 NOTE — Telephone Encounter (Signed)
Received a message from after hours service stating that patient is returning call.

## 2019-06-08 NOTE — Telephone Encounter (Signed)
-----   Message from Jerrol Banana., MD sent at 06/08/2019  7:48 AM EDT ----- Labs stable.

## 2019-06-09 NOTE — Telephone Encounter (Signed)
Pt advised.   Thanks,   -Melisssa Donner  

## 2019-07-04 DIAGNOSIS — I482 Chronic atrial fibrillation, unspecified: Secondary | ICD-10-CM | POA: Diagnosis not present

## 2019-07-04 DIAGNOSIS — Z950 Presence of cardiac pacemaker: Secondary | ICD-10-CM | POA: Diagnosis not present

## 2019-07-04 DIAGNOSIS — I10 Essential (primary) hypertension: Secondary | ICD-10-CM | POA: Diagnosis not present

## 2019-07-04 DIAGNOSIS — I495 Sick sinus syndrome: Secondary | ICD-10-CM | POA: Diagnosis not present

## 2019-07-12 ENCOUNTER — Other Ambulatory Visit: Payer: Self-pay | Admitting: Family Medicine

## 2019-08-12 ENCOUNTER — Other Ambulatory Visit: Payer: Self-pay | Admitting: Family Medicine

## 2019-10-17 DIAGNOSIS — Z23 Encounter for immunization: Secondary | ICD-10-CM | POA: Diagnosis not present

## 2019-11-07 ENCOUNTER — Other Ambulatory Visit: Payer: Self-pay

## 2019-12-11 NOTE — Progress Notes (Signed)
Patient: Savannah Tucker Female    DOB: 01/31/1930   85 y.o.   MRN: [id number] Visit Date: 12/19/2019  Today's Provider: Megan Mans, MD   No chief complaint on file.  Subjective:    Hypertension This is a chronic problem. The problem is unchanged. The problem is controlled. Pertinent negatives include no chest pain, headaches, palpitations or shortness of breath. The current treatment provides moderate improvement. There are no compliance problems.   She still lives alone.  She has had no falls.  She has 3 sons that live in the area.  She is a widow.  Hypertension, follow-up:  BP Readings from Last 3 Encounters:  12/19/19 (!) 168/85  06/06/19 132/70  01/04/19 138/84    She was last seen for hypertension 6 months ago.  BP at that visit was 132/70. Management changes since that visit include medication, pace maker implant. She reports good compliance with treatment. She is not having side effects.  She is not exercising. She is not adherent to low salt diet. She is experiencing irregular heart beat.  Patient denies chest pain, chest pressure/discomfort, claudication, dyspnea, exertional chest pressure/discomfort, fatigue, lower extremity edema, near-syncope, orthopnea, palpitations, paroxysmal nocturnal dyspnea, syncope and tachypnea.   Cardiovascular risk factors include advanced age (older than 15 for men, 75 for women).  Use of agents associated with hypertension:     Weight trend: stable Wt Readings from Last 3 Encounters:  12/19/19 127 lb 9.6 oz (57.9 kg)  06/06/19 130 lb (59 kg)  01/04/19 125 lb (56.7 kg)    Current diet: well balanced  ------------------------------------------------------------------------   Benign essential HTN From 07/04/2019-Controlled. Labs stable.   Adult hypothyroidism From 07/04/2019-Controlled. Labs stable.   Chronic atrial fibrillation From 07/04/2019-Controlled. Labs stable. No anticoagulation per cardiology   No  Known Allergies   Current Outpatient Medications:  .  amLODipine (NORVASC) 5 MG tablet, Take 1 tablet (5 mg total) by mouth daily., Disp: 30 tablet, Rfl: 3 .  aspirin 325 MG tablet, Take 325 mg by mouth. , Disp: , Rfl:  .  calcium carbonate (OS-CAL) 600 MG TABS tablet, Take 1,200 mg by mouth daily. , Disp: , Rfl:  .  levothyroxine (SYNTHROID) 50 MCG tablet, TAKE ONE TABLET BY MOUTH EVERY DAY, Disp: 90 tablet, Rfl: 3 .  timolol (TIMOPTIC) 0.5 % ophthalmic solution, Place 1 drop into both eyes daily. , Disp: , Rfl:   Review of Systems  Constitutional: Negative for appetite change, chills, fatigue and fever.  HENT: Negative.   Eyes: Negative.   Respiratory: Negative for chest tightness and shortness of breath.   Cardiovascular: Negative for chest pain and palpitations.  Gastrointestinal: Negative for abdominal pain, nausea and vomiting.  Endocrine: Negative.   Genitourinary: Negative.   Musculoskeletal: Negative.   Skin: Negative.   Allergic/Immunologic: Negative.   Neurological: Negative for dizziness, weakness and headaches.  Hematological: Negative.   Psychiatric/Behavioral: Negative.     Social History   Tobacco Use  . Smoking status: Former Games developer  . Smokeless tobacco: Never Used  . Tobacco comment: 11/1998  Substance Use Topics  . Alcohol use: Yes    Alcohol/week: 7.0 - 14.0 standard drinks    Types: 7 - 14 Glasses of wine per week      Objective:   BP (!) 168/85   Pulse 73   Temp (!) 97.2 F (36.2 C) (Temporal)   Resp 18   Ht 5\' 3"  (1.6 m)   Wt 127 lb 9.6  oz (57.9 kg)   SpO2 97%   BMI 22.60 kg/m  Vitals:   12/19/19 1341  BP: (!) 168/85  Pulse: 73  Resp: 18  Temp: (!) 97.2 F (36.2 C)  TempSrc: Temporal  SpO2: 97%  Weight: 127 lb 9.6 oz (57.9 kg)  Height: 5\' 3"  (1.6 m)  Body mass index is 22.6 kg/m.   Physical Exam Vitals reviewed.  Constitutional:      Appearance: Normal appearance.  HENT:     Head: Normocephalic and atraumatic.     Right  Ear: External ear normal.     Left Ear: External ear normal.     Nose: Nose normal.     Mouth/Throat:     Pharynx: Oropharynx is clear.  Eyes:     General: No scleral icterus.    Conjunctiva/sclera: Conjunctivae normal.  Cardiovascular:     Rate and Rhythm: Normal rate and regular rhythm.     Heart sounds: Normal heart sounds.  Pulmonary:     Effort: Pulmonary effort is normal.     Breath sounds: Normal breath sounds.  Abdominal:     Palpations: Abdomen is soft.  Musculoskeletal:     Right lower leg: No edema.     Left lower leg: No edema.  Skin:    General: Skin is warm and dry.  Neurological:     General: No focal deficit present.     Mental Status: She is alert and oriented to person, place, and time. Mental status is at baseline.  Psychiatric:        Mood and Affect: Mood normal.        Behavior: Behavior normal.        Thought Content: Thought content normal.        Judgment: Judgment normal.      No results found for any visits on 12/19/19.     Assessment & Plan    1. Benign essential HTN Patient wishes no changes today.  We will follow in this 85 year old.  2. Artificial cardiac pacemaker Followed by cardiology  3. Sick sinus syndrome ([place])   4. Chronic atrial fibrillation (HCC)   5. Adult hypothyroidism Follow-up 6 months.     Richard Cranford Mon, MD  [facility]

## 2019-12-19 ENCOUNTER — Encounter: Payer: Self-pay | Admitting: Family Medicine

## 2019-12-19 ENCOUNTER — Ambulatory Visit (INDEPENDENT_AMBULATORY_CARE_PROVIDER_SITE_OTHER): Payer: Medicare Other | Admitting: Family Medicine

## 2019-12-19 ENCOUNTER — Other Ambulatory Visit: Payer: Self-pay

## 2019-12-19 VITALS — BP 168/85 | HR 73 | Temp 97.2°F | Resp 18 | Ht 63.0 in | Wt 127.6 lb

## 2019-12-19 DIAGNOSIS — Z950 Presence of cardiac pacemaker: Secondary | ICD-10-CM | POA: Diagnosis not present

## 2019-12-19 DIAGNOSIS — I495 Sick sinus syndrome: Secondary | ICD-10-CM

## 2019-12-19 DIAGNOSIS — I482 Chronic atrial fibrillation, unspecified: Secondary | ICD-10-CM

## 2019-12-19 DIAGNOSIS — I10 Essential (primary) hypertension: Secondary | ICD-10-CM | POA: Diagnosis not present

## 2019-12-19 DIAGNOSIS — E039 Hypothyroidism, unspecified: Secondary | ICD-10-CM | POA: Diagnosis not present

## 2020-01-02 DIAGNOSIS — H401131 Primary open-angle glaucoma, bilateral, mild stage: Secondary | ICD-10-CM | POA: Diagnosis not present

## 2020-01-04 DIAGNOSIS — I495 Sick sinus syndrome: Secondary | ICD-10-CM | POA: Diagnosis not present

## 2020-01-11 DIAGNOSIS — I482 Chronic atrial fibrillation, unspecified: Secondary | ICD-10-CM | POA: Diagnosis not present

## 2020-01-11 DIAGNOSIS — I10 Essential (primary) hypertension: Secondary | ICD-10-CM | POA: Diagnosis not present

## 2020-01-11 DIAGNOSIS — Z950 Presence of cardiac pacemaker: Secondary | ICD-10-CM | POA: Diagnosis not present

## 2020-06-19 NOTE — Progress Notes (Signed)
Subjective:   Savannah Tucker is a 86 y.o. female who presents for Medicare Annual (Subsequent) preventive examination.  I connected with [facility] [employer] today by telephone and verified that I am speaking with the correct person using two identifiers. Location patient: home Location provider: work Persons participating in the virtual visit: patient, provider.   I discussed the limitations, risks, security and privacy concerns of performing an evaluation and management service by telephone and the availability of in person appointments. I also discussed with the patient that there may be a patient responsible charge related to this service. The patient expressed understanding and verbally consented to this telephonic visit.    Interactive audio and video telecommunications were attempted between this provider and patient, however failed, due to patient having technical difficulties OR patient did not have access to video capability.  We continued and completed visit with audio only.  Review of Systems    N/A  Cardiac Risk Factors include: advanced age (>56men, >58 women);hypertension     Objective:    There were no vitals filed for this visit. There is no height or weight on file to calculate BMI.  Advanced Directives 06/20/2020 11/11/2017 11/12/2015  Does Patient Have a Medical Advance Directive? Yes Yes Yes  Type of Estate agent of [place];Living will Healthcare Power of [place];Living will Healthcare Power of [place];Living will  Does patient want to make changes to medical advance directive? - - No - Patient declined  Copy of Healthcare Power of Attorney in Chart? No - copy requested No - copy requested No - copy requested    Current Medications (verified) Outpatient Encounter Medications as of 06/20/2020  Medication Sig  . aspirin 325 MG tablet Take 325 mg by mouth.   . calcium carbonate (OS-CAL) 600 MG TABS tablet Take 1,200 mg by mouth daily.   Marland Kitchen  levothyroxine (SYNTHROID) 50 MCG tablet TAKE ONE TABLET BY MOUTH EVERY DAY  . timolol (TIMOPTIC) 0.5 % ophthalmic solution Place 1 drop into both eyes daily.   Marland Kitchen amLODipine (NORVASC) 5 MG tablet Take 1 tablet (5 mg total) by mouth daily. (Patient not taking: Reported on 06/20/2020)   No facility-administered encounter medications on file as of 06/20/2020.    Allergies (verified) Patient has no known allergies.   History: Past Medical History:  Diagnosis Date  . Hyperlipidemia   . Hypertension    Past Surgical History:  Procedure Laterality Date  . APPENDECTOMY    . BREAST BIOPSY Left 2002   EXCISIONAL - NEG  . CATARACT EXTRACTION, BILATERAL    . TOTAL HIP ARTHROPLASTY Right   . TOTAL HIP ARTHROPLASTY Left    Family History  Problem Relation Age of Onset  . Asthma Mother   . Hypertension Mother   . Atrial fibrillation Mother   . Osteoporosis Mother   . Ulcers Mother   . Diabetes Father   . Hypertension Father   . Heart disease Father   . Heart disease Brother   . Hypertension Brother   . Diabetes Brother   . Dementia Brother   . Diabetes Son   . Heart attack Son    Social History   Socioeconomic History  . Marital status: Widowed    Spouse name: Not on file  . Number of children: 3  . Years of education: Not on file  . Highest education level: Some college, no degree  Occupational History  . Not on file  Tobacco Use  . Smoking status: Former Games developer  .  Smokeless tobacco: Never Used  . Tobacco comment: 11/1998  Vaping Use  . Vaping Use: Never used  Substance and Sexual Activity  . Alcohol use: Yes    Alcohol/week: 7.0 - 14.0 standard drinks    Types: 7 - 14 Glasses of wine per week  . Drug use: No  . Sexual activity: Not on file  Other Topics Concern  . Not on file  Social History Narrative  . Not on file   Social Determinants of Health   Financial Resource Strain: Low Risk   . Difficulty of Paying Living Expenses: Not hard at all  Food Insecurity: No  Food Insecurity  . Worried About Programme researcher, broadcasting/film/video in the Last Year: Never true  . Ran Out of Food in the Last Year: Never true  Transportation Needs: No Transportation Needs  . Lack of Transportation (Medical): No  . Lack of Transportation (Non-Medical): No  Physical Activity: Inactive  . Days of Exercise per Week: 0 days  . Minutes of Exercise per Session: 0 min  Stress: No Stress Concern Present  . Feeling of Stress : Not at all  Social Connections: Socially Isolated  . Frequency of Communication with Friends and Family: More than three times a week  . Frequency of Social Gatherings with Friends and Family: More than three times a week  . Attends Religious Services: Never  . Active Member of Clubs or Organizations: No  . Attends Banker Meetings: Never  . Marital Status: Widowed    Tobacco Counseling Counseling given: Not Answered Comment: 11/1998   Clinical Intake:  Pre-visit preparation completed: Yes  Pain : No/denies pain     Nutritional Risks: None Diabetes: No  How often do you need to have someone help you when you read instructions, pamphlets, or other written materials from your doctor or pharmacy?: 1 - Never  Diabetic? No  Interpreter Needed?: No  Information entered by :: [facility], LPN   Activities of Daily Living In your present state of health, do you have any difficulty performing the following activities: 06/20/2020 12/19/2019  Hearing? Y Y  Comment Has bilateral hearing aids. -  Vision? N N  Difficulty concentrating or making decisions? N Y  Walking or climbing stairs? Y Y  Comment Due to hip pains. -  Dressing or bathing? N N  Doing errands, shopping? N N  Preparing Food and eating ? N -  Using the Toilet? N -  In the past six months, have you accidently leaked urine? N -  Do you have problems with loss of bowel control? N -  Managing your Medications? N -  Managing your Finances? N -  Housekeeping or managing your  Housekeeping? N -  Some recent data might be hidden    Patient Care Team: Maple Hudson., MD as PCP - General (Family Medicine) Lockie Mola, MD as Referring Physician (Ophthalmology) Lamar Blinks, MD as Consulting Physician (Cardiology)  Indicate any recent Medical Services you may have received from other than [facility] providers in the past year (date may be approximate).     Assessment:   This is a routine wellness examination for Savannah Tucker.  Hearing/Vision screen No exam data present  Dietary issues and exercise activities discussed: Current Exercise Habits: The patient does not participate in regular exercise at present, Exercise limited by: None identified  Goals    . DIET - INCREASE WATER INTAKE     Recommend increasing of water intake to 4 glasses a day.     Marland Kitchen  Prevent falls     Recommend to remove any items from the home that may cause slips or trips.      Depression Screen PHQ 2/9 Scores 06/20/2020 06/06/2019 11/11/2017 11/09/2016 11/06/2015  PHQ - 2 Score 0 0 0 0 0    Fall Risk Fall Risk  06/20/2020 12/19/2019 11/07/2019 11/11/2017 11/09/2016  Falls in the past year? 1 1 1  Yes Yes  Comment - - Emmi Telephone Survey: data to providers prior to load - -  Number falls in past yr: 0 0 1 2 or more 1  Comment - - Emmi Telephone Survey Actual Response = 1 - -  Injury with Fall? 0 1 1 No No  Risk for fall due to : - Orthopedic patient - - -  Follow up Falls prevention discussed Falls evaluation completed - Falls prevention discussed -    Any stairs in or around the home? Yes  If so, are there any without handrails? No  Home free of loose throw rugs in walkways, pet beds, electrical cords, etc? Yes  Adequate lighting in your home to reduce risk of falls? Yes   ASSISTIVE DEVICES UTILIZED TO PREVENT FALLS:  Life alert? No  Use of a cane, walker or w/c? No  Grab bars in the bathroom? No  Shower chair or bench in shower? Yes  Elevated toilet seat or a  handicapped toilet? Yes    Cognitive Function: Declined today.         Immunizations Immunization History  Administered Date(s) Administered  . Influenza, High Dose Seasonal PF 10/17/2019  . Influenza-Unspecified 09/13/2013, 10/14/2018  . PFIZER SARS-COV-2 Vaccination 12/28/2019, 01/18/2020  . Pneumococcal Conjugate-13 10/15/2014  . Pneumococcal Polysaccharide-23 09/14/2007, 10/17/2019  . Td 07/30/2003  . Tdap 11/12/2014    TDAP status: Up to date Flu Vaccine status: Up to date Pneumococcal vaccine status: Up to date Covid-19 vaccine status: Completed vaccines  Qualifies for Shingles Vaccine? Yes   Zostavax completed No   Shingrix Completed?: No.    Education has been provided regarding the importance of this vaccine. Patient has been advised to call insurance company to determine out of pocket expense if they have not yet received this vaccine. Advised may also receive vaccine at local pharmacy or Health Dept. Verbalized acceptance and understanding.  Screening Tests Health Maintenance  Topic Date Due  . INFLUENZA VACCINE  07/14/2020  . DEXA SCAN  01/07/2022  . TETANUS/TDAP  11/12/2024  . COVID-19 Vaccine  Completed  . PNA vac Low Risk Adult  Completed    Health Maintenance  There are no preventive care reminders to display for this patient.  Colorectal cancer screening: No longer required.  Mammogram status: No longer required.  Bone Density status: Completed 01/07/17. Results reflect: Bone density results: OSTEOPENIA. Repeat every 5 years.  Lung Cancer Screening: (Low Dose CT Chest recommended if Age 73-80 years, 30 pack-year currently smoking OR have quit w/in 15years.) does not qualify.    Additional Screening:  Vision Screening: Recommended annual ophthalmology exams for early detection of glaucoma and other disorders of the eye. Is the patient up to date with their annual eye exam?  Yes  Who is the provider or what is the name of the office in which the  patient attends annual eye exams? Dr Inez PilgrimBrasington @ [facility] If pt is not established with a provider, would they like to be referred to a provider to establish care? No .   Dental Screening: Recommended annual dental exams for proper oral hygiene  Community Resource Referral / Chronic Care Management: CRR required this visit?  No   CCM required this visit?  No      Plan:     I have personally reviewed and noted the following in the patient's chart:   . Medical and social history . Use of alcohol, tobacco or illicit drugs  . Current medications and supplements . Functional ability and status . Nutritional status . Physical activity . Advanced directives . List of other physicians . Hospitalizations, surgeries, and ER visits in previous 12 months . Vitals . Screenings to include cognitive, depression, and falls . Referrals and appointments  In addition, I have reviewed and discussed with patient certain preventive protocols, quality metrics, and best practice recommendations. A written personalized care plan for preventive services as well as general preventive health recommendations were provided to patient.     Gianni Fuchs [place], [place]   1/0/2725   Nurse Notes: None.

## 2020-06-19 NOTE — Progress Notes (Signed)
Annual Wellness Visit     Patient: Savannah Tucker, Female    DOB: 10/06/1930, 86 y.o.   MRN: [id number] Visit Date: 06/20/2020  Today's Provider: Megan Mans, MD   Chief Complaint  Patient presents with  . Annual Exam   Subjective    Savannah Tucker is a 86 y.o. female who presents today for her Annual Wellness Visit. She reports consuming a general diet. The patient does not participate in regular exercise at present. She generally feels well. She reports sleeping fairly well. She does not have additional problems to discuss today.  Overall patient feels well.  Blood pressures  run 120/80 at home.  Her daughter-in-law is a Engineer, civil (consulting) and checks in for her.  She has had no falls.  She has had both Covid vaccines. HPI  Had AWV with [facility] today at 9:00 am.   Patient Active Problem List   Diagnosis Date Noted  . Dizziness 12/15/2018  . Arthritis 11/06/2015  . Alopecia 11/01/2015  . Chronic atrial fibrillation (HCC) 11/01/2015  . Decrease in the ability to hear 11/01/2015  . Glaucoma 11/01/2015  . HLD (hyperlipidemia) 11/01/2015  . Adult hypothyroidism 11/01/2015  . Osteopenia 11/01/2015  . Artificial cardiac pacemaker 11/01/2015  . Endocarditis 11/01/2015  . Avitaminosis D 11/01/2015  . Sick sinus syndrome (HCC) 12/31/2014  . Benign essential HTN 12/31/2014   Social History   Tobacco Use  . Smoking status: Former Games developer  . Smokeless tobacco: Never Used  . Tobacco comment: 11/1998  Vaping Use  . Vaping Use: Never used  Substance Use Topics  . Alcohol use: Yes    Alcohol/week: 7.0 - 14.0 standard drinks    Types: 7 - 14 Glasses of wine per week  . Drug use: No   No Known Allergies   Medications: Outpatient Medications Prior to Visit  Medication Sig  . aspirin 325 MG tablet Take 325 mg by mouth.   . calcium carbonate (OS-CAL) 600 MG TABS tablet Take 1,200 mg by mouth daily.   Marland Kitchen levothyroxine (SYNTHROID) 50 MCG tablet TAKE ONE TABLET BY MOUTH EVERY DAY  .  timolol (TIMOPTIC) 0.5 % ophthalmic solution Place 1 drop into both eyes daily.   Marland Kitchen amLODipine (NORVASC) 5 MG tablet Take 1 tablet (5 mg total) by mouth daily. (Patient not taking: Reported on 06/20/2020)   No facility-administered medications prior to visit.    No Known Allergies  Patient Care Team: Maple Hudson., MD as PCP - General (Family Medicine) Lockie Mola, MD as Referring Physician (Ophthalmology) Lamar Blinks, MD as Consulting Physician (Cardiology)  Review of Systems  Constitutional: Negative.   HENT: Negative.   Eyes: Negative.   Respiratory: Negative.   Cardiovascular: Negative.   Gastrointestinal: Negative.   Endocrine: Negative.   Genitourinary: Negative.   Musculoskeletal: Negative.   Skin: Negative.   Allergic/Immunologic: Negative.   Neurological: Negative.   Hematological: Negative.   Psychiatric/Behavioral: Negative.       Objective    Vitals: BP (!) 178/85   Pulse 74   Temp (!) 96.9 F (36.1 C) (Temporal)   Wt 128 lb (58.1 kg)   BMI 22.67 kg/m    Physical Exam Vitals reviewed.  Constitutional:      Appearance: Normal appearance.  HENT:     Head: Normocephalic and atraumatic.  Eyes:     Extraocular Movements: Extraocular movements intact.     Conjunctiva/sclera: Conjunctivae normal.     Pupils: Pupils are equal, round, and reactive to  light.  Neck:     Vascular: No carotid bruit.  Cardiovascular:     Rate and Rhythm: Normal rate and regular rhythm.     Pulses: Normal pulses.     Heart sounds: Normal heart sounds.  Pulmonary:     Effort: Pulmonary effort is normal.     Breath sounds: Normal breath sounds.  Abdominal:     General: Bowel sounds are normal.     Palpations: Abdomen is soft.     Tenderness: There is no abdominal tenderness.  Musculoskeletal:     Cervical back: No tenderness.     Right lower leg: No edema.     Left lower leg: No edema.  Lymphadenopathy:     Cervical: No cervical adenopathy.   Skin:    General: Skin is warm and dry.  Neurological:     Mental Status: She is alert and oriented to person, place, and time. Mental status is at baseline.  Psychiatric:        Mood and Affect: Mood normal.        Behavior: Behavior normal.        Thought Content: Thought content normal.        Judgment: Judgment normal.      Most recent functional status assessment: In your present state of health, do you have any difficulty performing the following activities: 06/20/2020  Hearing? Y  Comment Has bilateral hearing aids.  Vision? N  Difficulty concentrating or making decisions? N  Walking or climbing stairs? Y  Comment Due to hip pains.  Dressing or bathing? N  Doing errands, shopping? N  Preparing Food and eating ? N  Using the Toilet? N  In the past six months, have you accidently leaked urine? N  Do you have problems with loss of bowel control? N  Managing your Medications? N  Managing your Finances? N  Housekeeping or managing your Housekeeping? N  Some recent data might be hidden   Most recent fall risk assessment: Fall Risk  06/20/2020  Falls in the past year? 1  Comment -  Number falls in past yr: 0  Comment -  Injury with Fall? 0  Risk for fall due to : -  Follow up Falls prevention discussed    Most recent depression screenings: PHQ 2/9 Scores 06/20/2020 06/06/2019  PHQ - 2 Score 0 0   Most recent cognitive screening: No flowsheet data found. Most recent Audit-C alcohol use screening Alcohol Use Disorder Test (AUDIT) 06/20/2020  1. How often do you have a drink containing alcohol? 4  2. How many drinks containing alcohol do you have on a typical day when you are drinking? 0  3. How often do you have six or more drinks on one occasion? 0  AUDIT-C Score 4  4. How often during the last year have you found that you were not able to stop drinking once you had started? 0  5. How often during the last year have you failed to do what was normally expected from you  because of drinking? 0  6. How often during the last year have you needed a first drink in the morning to get yourself going after a heavy drinking session? 0  7. How often during the last year have you had a feeling of guilt of remorse after drinking? 0  8. How often during the last year have you been unable to remember what happened the night before because you had been drinking? 0  9. Have you  or someone else been injured as a result of your drinking? 0  10. Has a relative or friend or a doctor or another health worker been concerned about your drinking or suggested you cut down? 0  Alcohol Use Disorder Identification Test Final Score (AUDIT) 4  Alcohol Brief Interventions/Follow-up AUDIT Score <7 follow-up not indicated   A score of 3 or more in women, and 4 or more in men indicates increased risk for alcohol abuse, EXCEPT if all of the points are from question 1   No results found for any visits on 06/20/20.  Assessment & Plan     Annual wellness visit done today including the all of the following: Reviewed patient's Family Medical History Reviewed and updated list of patient's medical providers Assessment of cognitive impairment was done Assessed patient's functional ability Established a written schedule for health screening services Health Risk Assessent Completed and Reviewed  Exercise Activities and Dietary recommendations Goals    . DIET - INCREASE WATER INTAKE     Recommend increasing of water intake to 4 glasses a day.     . Prevent falls     Recommend to remove any items from the home that may cause slips or trips.       Immunization History  Administered Date(s) Administered  . Influenza, High Dose Seasonal PF 10/17/2019  . Influenza-Unspecified 09/13/2013, 10/14/2018  . PFIZER SARS-COV-2 Vaccination 12/28/2019, 01/18/2020  . Pneumococcal Conjugate-13 10/15/2014  . Pneumococcal Polysaccharide-23 09/14/2007, 10/17/2019  . Td 07/30/2003  . Tdap 11/12/2014     Health Maintenance  Topic Date Due  . INFLUENZA VACCINE  07/14/2020  . DEXA SCAN  01/07/2022  . TETANUS/TDAP  11/12/2024  . COVID-19 Vaccine  Completed  . PNA vac Low Risk Adult  Completed     Discussed health benefits of physical activity, and encouraged her to engage in regular exercise appropriate for her age and condition.    Problem List Items Addressed This Visit      Cardiovascular and Mediastinum   Benign essential HTN    BP elevated today in the office.  Pt states her BP are around 120/80 at home.  Pt is off of Amlodipine secondary to side effects.  No changes at this time, continue to monitor at home and advised to bring in BP cuff at follow up so we can compare readings.  Recheck in about four months.       Relevant Orders   CBC with Differential/Platelet   TSH   Comprehensive metabolic panel    Other Visit Diagnoses    Annual physical exam    -  Primary   Relevant Orders   CBC with Differential/Platelet   TSH   Comprehensive metabolic panel     Problem List Items Addressed This Visit      Cardiovascular and Mediastinum   Chronic atrial fibrillation (HCC)   Benign essential HTN    BP elevated today in the office.  Pt states her BP are around 120/80 at home.  Pt is off of Amlodipine secondary to side effects.  No changes at this time, continue to monitor at home and advised to bring in BP cuff at follow up so we can compare readings.  Recheck in about four months.       Relevant Orders   CBC with Differential/Platelet (Completed)   TSH (Completed)   Comprehensive metabolic panel     Endocrine   Adult hypothyroidism - Primary    Other Visit Diagnoses    Annual  physical exam       Relevant Orders   CBC with Differential/Platelet (Completed)   TSH (Completed)   Comprehensive metabolic panel       No follow-ups on file.     I, Megan Mans, MD, have reviewed all documentation for this visit. The documentation on 06/22/20 for the  exam, diagnosis, procedures, and orders are all accurate and complete.    Savannah Bento Wendelyn Breslow, MD  [facility] [phone] (phone) [phone] (fax)  [facility]

## 2020-06-20 ENCOUNTER — Other Ambulatory Visit: Payer: Self-pay

## 2020-06-20 ENCOUNTER — Ambulatory Visit (INDEPENDENT_AMBULATORY_CARE_PROVIDER_SITE_OTHER): Payer: Medicare Other | Admitting: Family Medicine

## 2020-06-20 ENCOUNTER — Ambulatory Visit (INDEPENDENT_AMBULATORY_CARE_PROVIDER_SITE_OTHER): Payer: Medicare Other

## 2020-06-20 ENCOUNTER — Encounter: Payer: Self-pay | Admitting: Family Medicine

## 2020-06-20 VITALS — BP 178/85 | HR 74 | Temp 96.9°F | Wt 128.0 lb

## 2020-06-20 DIAGNOSIS — Z0000 Encounter for general adult medical examination without abnormal findings: Secondary | ICD-10-CM

## 2020-06-20 DIAGNOSIS — I482 Chronic atrial fibrillation, unspecified: Secondary | ICD-10-CM

## 2020-06-20 DIAGNOSIS — I10 Essential (primary) hypertension: Secondary | ICD-10-CM | POA: Diagnosis not present

## 2020-06-20 DIAGNOSIS — E039 Hypothyroidism, unspecified: Secondary | ICD-10-CM

## 2020-06-20 NOTE — Patient Instructions (Signed)
Savannah Tucker , Thank you for taking time to come for your Medicare Wellness Visit. I appreciate your ongoing commitment to your health goals. Please review the following plan we discussed and let me know if I can assist you in the future.   Screening recommendations/referrals: Colonoscopy: No longer required.  Mammogram: No longer required.  Bone Density: Up to date, due 12/2021 Recommended yearly ophthalmology/optometry visit for glaucoma screening and checkup Recommended yearly dental visit for hygiene and checkup  Vaccinations: Influenza vaccine: Done 10/17/19 Pneumococcal vaccine: Completed series Tdap vaccine: Up to date, due 10/2024 Shingles vaccine: Currently due, declined today.     Advanced directives: Please bring a copy of your POA (Power of Attorney) and/or Living Will to your next appointment.   Conditions/risks identified: Fall risk preventatives discussed today. Recommend increasing water intake to 6-8 8 oz glasses a day.  Next appointment: 2:00 PM today with Dr Sullivan Lone    Preventive Care 61 Years and Older, Female Preventive care refers to lifestyle choices and visits with your health care provider that can promote health and wellness. What does preventive care include?  A yearly physical exam. This is also called an annual well check.  Dental exams once or twice a year.  Routine eye exams. Ask your health care provider how often you should have your eyes checked.  Personal lifestyle choices, including:  Daily care of your teeth and gums.  Regular physical activity.  Eating a healthy diet.  Avoiding tobacco and drug use.  Limiting alcohol use.  Practicing safe sex.  Taking low-dose aspirin every day.  Taking vitamin and mineral supplements as recommended by your health care provider. What happens during an annual well check? The services and screenings done by your health care provider during your annual well check will depend on your age, overall  health, lifestyle risk factors, and family history of disease. Counseling  Your health care provider may ask you questions about your:  Alcohol use.  Tobacco use.  Drug use.  Emotional well-being.  Home and relationship well-being.  Sexual activity.  Eating habits.  History of falls.  Memory and ability to understand (cognition).  Work and work Astronomer.  Reproductive health. Screening  You may have the following tests or measurements:  Height, weight, and BMI.  Blood pressure.  Lipid and cholesterol levels. These may be checked every 5 years, or more frequently if you are over 21 years old.  Skin check.  Lung cancer screening. You may have this screening every year starting at age 84 if you have a 30-pack-year history of smoking and currently smoke or have quit within the past 15 years.  Fecal occult blood test (FOBT) of the stool. You may have this test every year starting at age 84.  Flexible sigmoidoscopy or colonoscopy. You may have a sigmoidoscopy every 5 years or a colonoscopy every 10 years starting at age 84.  Hepatitis C blood test.  Hepatitis B blood test.  Sexually transmitted disease (STD) testing.  Diabetes screening. This is done by checking your blood sugar (glucose) after you have not eaten for a while (fasting). You may have this done every 1-3 years.  Bone density scan. This is done to screen for osteoporosis. You may have this done starting at age 84.  Mammogram. This may be done every 1-2 years. Talk to your health care provider about how often you should have regular mammograms. Talk with your health care provider about your test results, treatment options, and if necessary, the need  for more tests. Vaccines  Your health care provider may recommend certain vaccines, such as:  Influenza vaccine. This is recommended every year.  Tetanus, diphtheria, and acellular pertussis (Tdap, Td) vaccine. You may need a Td booster every 10  years.  Zoster vaccine. You may need this after age 84.  Pneumococcal 13-valent conjugate (PCV13) vaccine. One dose is recommended after age 84.  Pneumococcal polysaccharide (PPSV23) vaccine. One dose is recommended after age 84. Talk to your health care provider about which screenings and vaccines you need and how often you need them. This information is not intended to replace advice given to you by your health care provider. Make sure you discuss any questions you have with your health care provider. Document Released: 12/27/2015 Document Revised: 08/19/2016 Document Reviewed: 10/01/2015 Elsevier Interactive Patient Education  2017 [place] Prevention in the Home Falls can cause injuries. They can happen to people of all ages. There are many things you can do to make your home safe and to help prevent falls. What can I do on the outside of my home?  Regularly fix the edges of walkways and driveways and fix any cracks.  Remove anything that might make you trip as you walk through a door, such as a raised step or threshold.  Trim any bushes or trees on the path to your home.  Use bright outdoor lighting.  Clear any walking paths of anything that might make someone trip, such as rocks or tools.  Regularly check to see if handrails are loose or broken. Make sure that both sides of any steps have handrails.  Any raised decks and porches should have guardrails on the edges.  Have any leaves, snow, or ice cleared regularly.  Use sand or salt on walking paths during winter.  Clean up any spills in your garage right away. This includes oil or grease spills. What can I do in the bathroom?  Use night lights.  Install grab bars by the toilet and in the tub and shower. Do not use towel bars as grab bars.  Use non-skid mats or decals in the tub or shower.  If you need to sit down in the shower, use a plastic, non-slip stool.  Keep the floor dry. Clean up any water that  spills on the floor as soon as it happens.  Remove soap buildup in the tub or shower regularly.  Attach bath mats securely with double-sided non-slip rug tape.  Do not have throw rugs and other things on the floor that can make you trip. What can I do in the bedroom?  Use night lights.  Make sure that you have a light by your bed that is easy to reach.  Do not use any sheets or blankets that are too big for your bed. They should not hang down onto the floor.  Have a firm chair that has side arms. You can use this for support while you get dressed.  Do not have throw rugs and other things on the floor that can make you trip. What can I do in the kitchen?  Clean up any spills right away.  Avoid walking on wet floors.  Keep items that you use a lot in easy-to-reach places.  If you need to reach something above you, use a strong step stool that has a grab bar.  Keep electrical cords out of the way.  Do not use floor polish or wax that makes floors slippery. If you must use wax, use  non-skid floor wax.  Do not have throw rugs and other things on the floor that can make you trip. What can I do with my stairs?  Do not leave any items on the stairs.  Make sure that there are handrails on both sides of the stairs and use them. Fix handrails that are broken or loose. Make sure that handrails are as long as the stairways.  Check any carpeting to make sure that it is firmly attached to the stairs. Fix any carpet that is loose or worn.  Avoid having throw rugs at the top or bottom of the stairs. If you do have throw rugs, attach them to the floor with carpet tape.  Make sure that you have a light switch at the top of the stairs and the bottom of the stairs. If you do not have them, ask someone to add them for you. What else can I do to help prevent falls?  Wear shoes that:  Do not have high heels.  Have rubber bottoms.  Are comfortable and fit you well.  Are closed at the  toe. Do not wear sandals.  If you use a stepladder:  Make sure that it is fully opened. Do not climb a closed stepladder.  Make sure that both sides of the stepladder are locked into place.  Ask someone to hold it for you, if possible.  Clearly mark and make sure that you can see:  Any grab bars or handrails.  First and last steps.  Where the edge of each step is.  Use tools that help you move around (mobility aids) if they are needed. These include:  Canes.  Walkers.  Scooters.  Crutches.  Turn on the lights when you go into a dark area. Replace any light bulbs as soon as they burn out.  Set up your furniture so you have a clear path. Avoid moving your furniture around.  If any of your floors are uneven, fix them.  If there are any pets around you, be aware of where they are.  Review your medicines with your doctor. Some medicines can make you feel dizzy. This can increase your chance of falling. Ask your doctor what other things that you can do to help prevent falls. This information is not intended to replace advice given to you by your health care provider. Make sure you discuss any questions you have with your health care provider. Document Released: 09/26/2009 Document Revised: 05/07/2016 Document Reviewed: 01/04/2015 Elsevier Interactive Patient Education  2017 [employer].

## 2020-06-20 NOTE — Assessment & Plan Note (Signed)
BP elevated today in the office.  Pt states her BP are around 120/80 at home.  Pt is off of Amlodipine secondary to side effects.  No changes at this time, continue to monitor at home and advised to bring in BP cuff at follow up so we can compare readings.  Recheck in about four months.

## 2020-06-20 NOTE — Patient Instructions (Signed)
Preventive Care 84 Years and Older, Female Preventive care refers to lifestyle choices and visits with your health care provider that can promote health and wellness. This includes:  A yearly physical exam. This is also called an annual well check.  Regular dental and eye exams.  Immunizations.  Screening for certain conditions.  Healthy lifestyle choices, such as diet and exercise. What can I expect for my preventive care visit? Physical exam Your health care provider will check:  Height and weight. These may be used to calculate body mass index (BMI), which is a measurement that tells if you are at a healthy weight.  Heart rate and blood pressure.  Your skin for abnormal spots. Counseling Your health care provider may ask you questions about:  Alcohol, tobacco, and drug use.  Emotional well-being.  Home and relationship well-being.  Sexual activity.  Eating habits.  History of falls.  Memory and ability to understand (cognition).  Work and work Statistician.  Pregnancy and menstrual history. What immunizations do I need?  Influenza (flu) vaccine  This is recommended every year. Tetanus, diphtheria, and pertussis (Tdap) vaccine  You may need a Td booster every 10 years. Varicella (chickenpox) vaccine  You may need this vaccine if you have not already been vaccinated. Zoster (shingles) vaccine  You may need this after age 84. Pneumococcal conjugate (PCV13) vaccine  One dose is recommended after age 84. Pneumococcal polysaccharide (PPSV23) vaccine  One dose is recommended after age 84. Measles, mumps, and rubella (MMR) vaccine  You may need at least one dose of MMR if you were born in 1957 or later. You may also need a second dose. Meningococcal conjugate (MenACWY) vaccine  You may need this if you have certain conditions. Hepatitis A vaccine  You may need this if you have certain conditions or if you travel or work in places where you may be exposed  to hepatitis A. Hepatitis B vaccine  You may need this if you have certain conditions or if you travel or work in places where you may be exposed to hepatitis B. Haemophilus influenzae type b (Hib) vaccine  You may need this if you have certain conditions. You may receive vaccines as individual doses or as more than one vaccine together in one shot (combination vaccines). Talk with your health care provider about the risks and benefits of combination vaccines. What tests do I need? Blood tests  Lipid and cholesterol levels. These may be checked every 5 years, or more frequently depending on your overall health.  Hepatitis C test.  Hepatitis B test. Screening  Lung cancer screening. You may have this screening every year starting at age 84 if you have a 30-pack-year history of smoking and currently smoke or have quit within the past 15 years.  Colorectal cancer screening. All adults should have this screening starting at age 84 and continuing until age 4. Your health care provider may recommend screening at age 84 if you are at increased risk. You will have tests every 1-10 years, depending on your results and the type of screening test.  Diabetes screening. This is done by checking your blood sugar (glucose) after you have not eaten for a while (fasting). You may have this done every 1-3 years.  Mammogram. This may be done every 1-2 years. Talk with your health care provider about how often you should have regular mammograms.  BRCA-related cancer screening. This may be done if you have a family history of breast, ovarian, tubal, or peritoneal cancers.  Other tests °· Sexually transmitted disease (STD) testing. °· Bone density scan. This is done to screen for osteoporosis. You may have this done starting at age 84. °Follow these instructions at home: °Eating and drinking °· Eat a diet that includes fresh fruits and vegetables, whole grains, lean protein, and low-fat dairy products. Limit  your intake of foods with high amounts of sugar, saturated fats, and salt. °· Take vitamin and mineral supplements as recommended by your health care provider. °· Do not drink alcohol if your health care provider tells you not to drink. °· If you drink alcohol: °? Limit how much you have to 0-1 drink a day. °? Be aware of how much alcohol is in your drink. In the [place] one drink equals one 12 oz bottle of beer (355 mL), one 5 oz glass of wine (148 mL), or one 1½ oz glass of hard liquor (44 mL). °Lifestyle °· Take daily care of your teeth and gums. °· Stay active. Exercise for at least 30 minutes on 5 or more days each week. °· Do not use any products that contain nicotine or tobacco, such as cigarettes, e-cigarettes, and chewing tobacco. If you need help quitting, ask your health care provider. °· If you are sexually active, practice safe sex. Use a condom or other form of protection in order to prevent STIs (sexually transmitted infections). °· Talk with your health care provider about taking a low-dose aspirin or statin. °What's next? °· Go to your health care provider once a year for a well check visit. °· Ask your health care provider how often you should have your eyes and teeth checked. °· Stay up to date on all vaccines. °This information is not intended to replace advice given to you by your health care provider. Make sure you discuss any questions you have with your health care provider. °Document Revised: 11/24/2018 Document Reviewed: 11/24/2018 °Elsevier Patient Education © 2020 [employer] ° °

## 2020-06-21 ENCOUNTER — Telehealth: Payer: Self-pay

## 2020-06-21 LAB — COMPREHENSIVE METABOLIC PANEL
ALT: 14 IU/L (ref 0–32)
AST: 18 IU/L (ref 0–40)
Albumin/Globulin Ratio: 1.8 (ref 1.2–2.2)
Albumin: 4.6 g/dL (ref 3.6–4.6)
Alkaline Phosphatase: 73 IU/L (ref 48–121)
BUN/Creatinine Ratio: 12 (ref 12–28)
BUN: 10 mg/dL (ref 8–27)
Bilirubin Total: 0.5 mg/dL (ref 0.0–1.2)
CO2: 23 mmol/L (ref 20–29)
Calcium: 10.4 mg/dL — ABNORMAL HIGH (ref 8.7–10.3)
Chloride: 96 mmol/L (ref 96–106)
Creatinine, Ser: 0.81 mg/dL (ref 0.57–1.00)
GFR calc Af Amer: 76 mL/min/{1.73_m2} (ref >59)
GFR calc non Af Amer: 66 mL/min/{1.73_m2} (ref >59)
Globulin, Total: 2.6 g/dL (ref 1.5–4.5)
Glucose: 91 mg/dL (ref 65–99)
Potassium: 4.3 mmol/L (ref 3.5–5.2)
Sodium: 136 mmol/L (ref 134–144)
Total Protein: 7.2 g/dL (ref 6.0–8.5)

## 2020-06-21 LAB — CBC WITH DIFFERENTIAL/PLATELET
Basophils Absolute: 0.1 10*3/uL (ref 0.0–0.2)
Basos: 1 %
EOS (ABSOLUTE): 0.2 10*3/uL (ref 0.0–0.4)
Eos: 3 %
Hematocrit: 39 % (ref 34.0–46.6)
Hemoglobin: 13.2 g/dL (ref 11.1–15.9)
Immature Grans (Abs): 0 10*3/uL (ref 0.0–0.1)
Immature Granulocytes: 0 %
Lymphocytes Absolute: 1.8 10*3/uL (ref 0.7–3.1)
Lymphs: 31 %
MCH: 33 pg (ref 26.6–33.0)
MCHC: 33.8 g/dL (ref 31.5–35.7)
MCV: 98 fL — ABNORMAL HIGH (ref 79–97)
Monocytes Absolute: 0.5 10*3/uL (ref 0.1–0.9)
Monocytes: 8 %
Neutrophils Absolute: 3.3 10*3/uL (ref 1.4–7.0)
Neutrophils: 57 %
Platelets: 289 10*3/uL (ref 150–450)
RBC: 4 x10E6/uL (ref 3.77–5.28)
RDW: 12.2 % (ref 11.7–15.4)
WBC: 5.8 10*3/uL (ref 3.4–10.8)

## 2020-06-21 LAB — TSH: TSH: 2.01 u[IU]/mL (ref 0.450–4.500)

## 2020-06-21 NOTE — Telephone Encounter (Signed)
Patient advised of lab results

## 2020-06-21 NOTE — Telephone Encounter (Signed)
-----   Message from Maple Hudson., MD sent at 06/21/2020  9:08 AM EDT ----- Labs stable.

## 2020-06-25 DIAGNOSIS — I495 Sick sinus syndrome: Secondary | ICD-10-CM | POA: Diagnosis not present

## 2020-07-01 DIAGNOSIS — I10 Essential (primary) hypertension: Secondary | ICD-10-CM | POA: Diagnosis not present

## 2020-07-01 DIAGNOSIS — Z950 Presence of cardiac pacemaker: Secondary | ICD-10-CM | POA: Diagnosis not present

## 2020-07-01 DIAGNOSIS — R0602 Shortness of breath: Secondary | ICD-10-CM | POA: Diagnosis not present

## 2020-07-01 DIAGNOSIS — I38 Endocarditis, valve unspecified: Secondary | ICD-10-CM | POA: Diagnosis not present

## 2020-07-01 DIAGNOSIS — I482 Chronic atrial fibrillation, unspecified: Secondary | ICD-10-CM | POA: Diagnosis not present

## 2020-07-01 DIAGNOSIS — I495 Sick sinus syndrome: Secondary | ICD-10-CM | POA: Diagnosis not present

## 2020-07-01 DIAGNOSIS — R0600 Dyspnea, unspecified: Secondary | ICD-10-CM | POA: Diagnosis not present

## 2020-07-02 ENCOUNTER — Other Ambulatory Visit: Payer: Self-pay | Admitting: Family Medicine

## 2020-07-02 DIAGNOSIS — H401131 Primary open-angle glaucoma, bilateral, mild stage: Secondary | ICD-10-CM | POA: Diagnosis not present

## 2020-08-07 DIAGNOSIS — R0600 Dyspnea, unspecified: Secondary | ICD-10-CM | POA: Diagnosis not present

## 2020-08-07 DIAGNOSIS — R0602 Shortness of breath: Secondary | ICD-10-CM | POA: Diagnosis not present

## 2020-08-07 DIAGNOSIS — I38 Endocarditis, valve unspecified: Secondary | ICD-10-CM | POA: Diagnosis not present

## 2020-10-14 DIAGNOSIS — Z23 Encounter for immunization: Secondary | ICD-10-CM | POA: Diagnosis not present

## 2020-10-22 ENCOUNTER — Ambulatory Visit: Payer: Medicare Other | Admitting: Family Medicine

## 2020-10-23 ENCOUNTER — Ambulatory Visit (INDEPENDENT_AMBULATORY_CARE_PROVIDER_SITE_OTHER): Payer: Medicare Other | Admitting: Family Medicine

## 2020-10-23 ENCOUNTER — Encounter: Payer: Self-pay | Admitting: Family Medicine

## 2020-10-23 ENCOUNTER — Other Ambulatory Visit: Payer: Self-pay

## 2020-10-23 VITALS — BP 149/86 | HR 81 | Temp 98.6°F | Resp 18 | Ht 63.0 in | Wt 128.0 lb

## 2020-10-23 DIAGNOSIS — M8580 Other specified disorders of bone density and structure, unspecified site: Secondary | ICD-10-CM

## 2020-10-23 DIAGNOSIS — I482 Chronic atrial fibrillation, unspecified: Secondary | ICD-10-CM | POA: Diagnosis not present

## 2020-10-23 DIAGNOSIS — E039 Hypothyroidism, unspecified: Secondary | ICD-10-CM

## 2020-10-23 DIAGNOSIS — Z950 Presence of cardiac pacemaker: Secondary | ICD-10-CM

## 2020-10-23 DIAGNOSIS — I10 Essential (primary) hypertension: Secondary | ICD-10-CM | POA: Diagnosis not present

## 2020-10-23 NOTE — Progress Notes (Signed)
I,April Miller,acting as a scribe for Megan Mans, MD.,have documented all relevant documentation on the behalf of Megan Mans, MD,as directed by  Megan Mans, MD while in the presence of Megan Mans, MD.   Established patient visit   Patient: Savannah Tucker   DOB: [birth year]   86 y.o. Female  MRN: [id number] Visit Date: 10/23/2020  Today's healthcare provider: Megan Mans, MD   Chief Complaint  Patient presents with  . Follow-up  . Hypertension   Subjective    HPI  Hypertension, follow-up  BP Readings from Last 3 Encounters:  10/23/20 (!) 149/86  06/20/20 (!) 178/85  12/19/19 (!) 168/85   Wt Readings from Last 3 Encounters:  10/23/20 128 lb (58.1 kg)  06/20/20 128 lb (58.1 kg)  12/19/19 127 lb 9.6 oz (57.9 kg)     She was last seen for hypertension 4 months ago.  BP at that visit was 178/85. Management since that visit includes; No changes at this time, continue to monitor at home and advised to bring in BP cuff at follow up so we can compare readings. She reports good compliance with treatment. She is not having side effects. none She is exercising. She is adherent to low salt diet.   Outside blood pressures are none.  She does not smoke.  Use of agents associated with hypertension: none.   --------------------------------------------------------------------      Medications: Outpatient Medications Prior to Visit  Medication Sig  . aspirin 325 MG tablet Take 325 mg by mouth.   . calcium carbonate (OS-CAL) 600 MG TABS tablet Take 1,200 mg by mouth daily.   Marland Kitchen levothyroxine (SYNTHROID) 50 MCG tablet TAKE 1 TABLET EVERY DAY ON EMPTY STOMACHWITH A GLASS OF WATER AT LEAST 30-60 MINBEFORE BREAKFAST  . timolol (TIMOPTIC) 0.5 % ophthalmic solution Place 1 drop into both eyes daily.   Marland Kitchen amLODipine (NORVASC) 5 MG tablet Take 1 tablet (5 mg total) by mouth daily. (Patient not taking: Reported on 06/20/2020)   No  facility-administered medications prior to visit.    Review of Systems  Constitutional: Negative for appetite change, chills, fatigue and fever.  Respiratory: Negative for chest tightness and shortness of breath.   Cardiovascular: Negative for chest pain and palpitations.  Gastrointestinal: Negative for abdominal pain, nausea and vomiting.  Neurological: Negative for dizziness and weakness.       Objective    BP (!) 149/86 (BP Location: Left Arm, Patient Position: Sitting, Cuff Size: Normal)   Pulse 81   Temp 98.6 F (37 C) (Oral)   Resp 18   Ht 5\' 3"  (1.6 m)   Wt 128 lb (58.1 kg)   SpO2 99%   BMI 22.67 kg/m  BP Readings from Last 3 Encounters:  10/23/20 (!) 149/86  06/20/20 (!) 178/85  12/19/19 (!) 168/85   Wt Readings from Last 3 Encounters:  10/23/20 128 lb (58.1 kg)  06/20/20 128 lb (58.1 kg)  12/19/19 127 lb 9.6 oz (57.9 kg)      Physical Exam Vitals reviewed.  Constitutional:      Appearance: Normal appearance.  HENT:     Head: Normocephalic and atraumatic.  Eyes:     Extraocular Movements: Extraocular movements intact.     Conjunctiva/sclera: Conjunctivae normal.     Pupils: Pupils are equal, round, and reactive to light.  Neck:     Vascular: No carotid bruit.  Cardiovascular:     Rate and Rhythm: Normal rate and regular rhythm.  Pulses: Normal pulses.     Heart sounds: Normal heart sounds.  Pulmonary:     Effort: Pulmonary effort is normal.     Breath sounds: Normal breath sounds.  Abdominal:     General: Bowel sounds are normal.     Palpations: Abdomen is soft.     Tenderness: There is no abdominal tenderness.  Musculoskeletal:     Cervical back: No tenderness.     Right lower leg: No edema.     Left lower leg: No edema.  Lymphadenopathy:     Cervical: No cervical adenopathy.  Skin:    General: Skin is warm and dry.  Neurological:     Mental Status: She is alert and oriented to person, place, and time. Mental status is at baseline.   Psychiatric:        Mood and Affect: Mood normal.        Behavior: Behavior normal.        Thought Content: Thought content normal.        Judgment: Judgment normal.       No results found for any visits on 10/23/20.  Assessment & Plan     1. Benign essential HTN Fair control  2. Chronic atrial fibrillation [facility]) Cardiology  3. Adult hypothyroidism Follow-up TSH  4. Osteopenia, unspecified location  5. Artificial cardiac pacemaker    Return in about 6 months (around 04/22/2021).         Kendal Raffo Wendelyn Breslow, MD  [facility] [phone] (phone) [phone] (fax)  [facility]

## 2020-12-24 DIAGNOSIS — I495 Sick sinus syndrome: Secondary | ICD-10-CM | POA: Diagnosis not present

## 2021-01-01 DIAGNOSIS — H401131 Primary open-angle glaucoma, bilateral, mild stage: Secondary | ICD-10-CM | POA: Diagnosis not present

## 2021-01-02 DIAGNOSIS — Z950 Presence of cardiac pacemaker: Secondary | ICD-10-CM | POA: Diagnosis not present

## 2021-01-02 DIAGNOSIS — I10 Essential (primary) hypertension: Secondary | ICD-10-CM | POA: Diagnosis not present

## 2021-01-02 DIAGNOSIS — I482 Chronic atrial fibrillation, unspecified: Secondary | ICD-10-CM | POA: Diagnosis not present

## 2021-01-07 DIAGNOSIS — H401131 Primary open-angle glaucoma, bilateral, mild stage: Secondary | ICD-10-CM | POA: Diagnosis not present

## 2021-04-01 DIAGNOSIS — I495 Sick sinus syndrome: Secondary | ICD-10-CM | POA: Diagnosis not present

## 2021-04-22 ENCOUNTER — Other Ambulatory Visit: Payer: Self-pay

## 2021-04-22 ENCOUNTER — Ambulatory Visit (INDEPENDENT_AMBULATORY_CARE_PROVIDER_SITE_OTHER): Payer: Medicare Other | Admitting: Family Medicine

## 2021-04-22 ENCOUNTER — Encounter: Payer: Self-pay | Admitting: Family Medicine

## 2021-04-22 VITALS — BP 166/79 | HR 72 | Temp 97.9°F | Resp 16 | Wt 123.0 lb

## 2021-04-22 DIAGNOSIS — J309 Allergic rhinitis, unspecified: Secondary | ICD-10-CM

## 2021-04-22 DIAGNOSIS — E039 Hypothyroidism, unspecified: Secondary | ICD-10-CM | POA: Diagnosis not present

## 2021-04-22 DIAGNOSIS — I10 Essential (primary) hypertension: Secondary | ICD-10-CM

## 2021-04-22 DIAGNOSIS — M5431 Sciatica, right side: Secondary | ICD-10-CM

## 2021-04-22 MED ORDER — PREDNISONE 20 MG PO TABS: 20.0000 mg | ORAL_TABLET | Freq: Every day | ORAL | 0 refills | Status: AC

## 2021-04-22 MED ORDER — CETIRIZINE HCL 10 MG PO TABS: 10.0000 mg | ORAL_TABLET | Freq: Every day | ORAL | 11 refills | Status: DC

## 2021-04-22 NOTE — Progress Notes (Signed)
Established patient visit   Patient: Savannah Tucker   DOB: 05/18/1930   87 y.o. Female  MRN: [id number] Visit Date: 04/22/2021  Today's healthcare provider: Megan Mans, MD   Chief Complaint  Patient presents with  . Hypertension  . Hip Pain   Subjective    Hip Pain  There was no injury mechanism. The pain is present in the right hip. The quality of the pain is described as aching and shooting. The pain is moderate. The pain has been constant since onset. Pertinent negatives include no numbness or tingling. The symptoms are aggravated by movement. She has tried heat and rest for the symptoms.  Overall patient feels well.  She thinks she has sciatica on the right.  Goes down the back of her right leg. She has some mild allergy symptoms recently. She has had COVID shots and 2 boosters. Hypertension, follow-up  BP Readings from Last 3 Encounters:  04/22/21 (!) 166/79  10/23/20 (!) 149/86  06/20/20 (!) 178/85   Wt Readings from Last 3 Encounters:  04/22/21 123 lb (55.8 kg)  10/23/20 128 lb (58.1 kg)  06/20/20 128 lb (58.1 kg)     She was last seen for hypertension 6 months ago.  BP at that visit was 149/86. Management since that visit includes; on amlodipine. She reports good compliance with treatment. She is not having side effects.  She is not exercising. She is adherent to low salt diet.   Outside blood pressures are checked occasionally.  She does not smoke.  Use of agents associated with hypertension: none.        Medications: Outpatient Medications Prior to Visit  Medication Sig  . amLODipine (NORVASC) 5 MG tablet Take 1 tablet (5 mg total) by mouth daily. (Patient not taking: Reported on 06/20/2020)  . aspirin 325 MG tablet Take 325 mg by mouth.   . calcium carbonate (OS-CAL) 600 MG TABS tablet Take 1,200 mg by mouth daily.   Marland Kitchen levothyroxine (SYNTHROID) 50 MCG tablet TAKE 1 TABLET EVERY DAY ON EMPTY STOMACHWITH A GLASS OF WATER AT LEAST 30-60  MINBEFORE BREAKFAST  . timolol (TIMOPTIC) 0.5 % ophthalmic solution Place 1 drop into both eyes daily.    No facility-administered medications prior to visit.    Review of Systems  Constitutional: Negative for appetite change, chills, fatigue and fever.  HENT: Positive for congestion, postnasal drip and sneezing.   Respiratory: Positive for cough. Negative for chest tightness and shortness of breath.   Cardiovascular: Negative for chest pain and palpitations.  Gastrointestinal: Negative for abdominal pain, nausea and vomiting.  Musculoskeletal: Positive for arthralgias and myalgias.  Allergic/Immunologic: Positive for environmental allergies.  Neurological: Negative for dizziness, tingling, weakness and numbness.        Objective    BP (!) 166/79   Pulse 72   Temp 97.9 F (36.6 C)   Resp 16   Wt 123 lb (55.8 kg)   BMI 21.79 kg/m  BP Readings from Last 3 Encounters:  04/22/21 (!) 166/79  10/23/20 (!) 149/86  06/20/20 (!) 178/85   Wt Readings from Last 3 Encounters:  04/22/21 123 lb (55.8 kg)  10/23/20 128 lb (58.1 kg)  06/20/20 128 lb (58.1 kg)       Physical Exam Vitals reviewed.  Constitutional:      Appearance: Normal appearance.  HENT:     Head: Normocephalic and atraumatic.  Eyes:     Extraocular Movements: Extraocular movements intact.     Conjunctiva/sclera:  Conjunctivae normal.     Pupils: Pupils are equal, round, and reactive to light.  Neck:     Vascular: No carotid bruit.  Cardiovascular:     Rate and Rhythm: Normal rate and regular rhythm.     Pulses: Normal pulses.     Heart sounds: Normal heart sounds.  Pulmonary:     Effort: Pulmonary effort is normal.     Breath sounds: Normal breath sounds.  Abdominal:     General: Bowel sounds are normal.     Palpations: Abdomen is soft.     Tenderness: There is no abdominal tenderness.  Musculoskeletal:     Cervical back: No tenderness.     Right lower leg: No edema.     Left lower leg: No edema.   Lymphadenopathy:     Cervical: No cervical adenopathy.  Skin:    General: Skin is warm and dry.  Neurological:     Mental Status: She is alert and oriented to person, place, and time. Mental status is at baseline.     Comments: Right leg raise is negative and she has no loss of strength in lower extremities.  Psychiatric:        Mood and Affect: Mood normal.        Behavior: Behavior normal.        Thought Content: Thought content normal.        Judgment: Judgment normal.       No results found for any visits on 04/22/21.  Assessment & Plan     1. Benign essential HTN Good control on amlodipine.  2. Adult hypothyroidism On levothyroxine. - TSH  3. Allergic rhinitis, unspecified seasonality, unspecified trigger Try Zyrtec. - cetirizine (ZYRTEC) 10 MG tablet; Take 1 tablet (10 mg total) by mouth daily.  Dispense: 30 tablet; Refill: 11  4. Sciatica of right side Prednisone for 5 days.  Conservative therapy is preferred by patient. - predniSONE (DELTASONE) 20 MG tablet; Take 1 tablet (20 mg total) by mouth daily with breakfast for 5 days.  Dispense: 5 tablet; Refill: 0   No follow-ups on file.      I, Megan Mans, MD, have reviewed all documentation for this visit. The documentation on 04/26/21 for the exam, diagnosis, procedures, and orders are all accurate and complete.    Orley Lawry Wendelyn Breslow, MD  [facility] [phone] (phone) [phone] (fax)  [facility]

## 2021-04-23 LAB — TSH: TSH: 2.23 u[IU]/mL (ref 0.450–4.500)

## 2021-04-29 ENCOUNTER — Telehealth: Payer: Self-pay | Admitting: Family Medicine

## 2021-04-29 DIAGNOSIS — M5431 Sciatica, right side: Secondary | ICD-10-CM

## 2021-04-29 MED ORDER — NAPROXEN 375 MG PO TABS: 375.0000 mg | ORAL_TABLET | Freq: Two times a day (BID) | ORAL | 0 refills | Status: DC

## 2021-04-29 NOTE — Telephone Encounter (Signed)
We saw patient on 04/22/21 and we prescribed prednisone 20mg  X 5 days for her pain. Patient is not feeling much better. Please advise. Thanks!

## 2021-04-29 NOTE — Telephone Encounter (Signed)
Try Naproxen 375mg  BID prn pain. Would pt like referral to pain clinic.

## 2021-04-29 NOTE — Telephone Encounter (Signed)
Patient was advised. Patient would like to try Naproxen and if that doesn't work she will let us know about referral to pain clinic.

## 2021-04-29 NOTE — Telephone Encounter (Signed)
Pt called stating that she is still in a lot of pain with her sciatic nerve. Pt is requesting to have medication sent over or to have PCP give a call back with advice. Please advise.     [facility] - [place], [place] - [address]  [name] [place] [address]  Phone: [phone] Fax: [phone]  Hours: Not open 24 hours

## 2021-06-25 ENCOUNTER — Other Ambulatory Visit: Payer: Self-pay | Admitting: Family Medicine

## 2021-07-01 ENCOUNTER — Ambulatory Visit: Payer: Self-pay

## 2021-07-01 NOTE — Telephone Encounter (Signed)
Pt. And daughter-in-law report pt. Is having pain in right hip that goes down the leg. "It's been going on awhile and she has been taking the naprosyn, but it doesn't help much. It's affecting her sleep now." Pain is 10/10 when walking. Wants to know what she should do to help this pain. Please advise pt.

## 2021-07-01 NOTE — Telephone Encounter (Signed)
Pts daughter in law Glenda Dowe called stating the pt has been in a lot of pain recently, wanted to speak with a nurse on what she can do or if she should start therapy Answer Assessment - Initial Assessment Questions 1. LOCATION and RADIATION: "Where is the pain located?"      Right hip and down the leg 2. QUALITY: "What does the pain feel like?"  (e.g., sharp, dull, aching, burning)     Sharp 3. SEVERITY: "How bad is the pain?" "What does it keep you from doing?"   (Scale 1-10; or mild, moderate, severe)   -  MILD (1-3): doesn't interfere with normal activities    -  MODERATE (4-7): interferes with normal activities (e.g., work or school) or awakens from sleep, limping    -  SEVERE (8-10): excruciating pain, unable to do any normal activities, unable to walk     10 4. ONSET: "When did the pain start?" "Does it come and go, or is it there all the time?"     Several months 5. WORK OR EXERCISE: "Has there been any recent work or exercise that involved this part of the body?"      No 6. CAUSE: "What do you think is causing the hip pain?"      Sciatica 7. AGGRAVATING FACTORS: "What makes the hip pain worse?" (e.g., walking, climbing stairs, running)     Walking 8. OTHER SYMPTOMS: "Do you have any other symptoms?" (e.g., back pain, pain shooting down leg,  fever, rash)     Pain down the leg  Protocols used: Hip Pain-A-AH

## 2021-07-02 NOTE — Telephone Encounter (Signed)
Patient's daughter called back to speak with nurse about pain in her hip. She would like a call back tomorrow after lunch. Please call back   See previous encounter from 07/01/21 triage call.

## 2021-07-03 ENCOUNTER — Telehealth: Payer: Self-pay | Admitting: *Deleted

## 2021-07-03 ENCOUNTER — Other Ambulatory Visit: Payer: Self-pay | Admitting: *Deleted

## 2021-07-03 DIAGNOSIS — M25551 Pain in right hip: Secondary | ICD-10-CM

## 2021-07-03 MED ORDER — PREDNISONE 20 MG PO TABS: 20.0000 mg | ORAL_TABLET | Freq: Every day | ORAL | 0 refills | Status: DC

## 2021-07-03 NOTE — Telephone Encounter (Signed)
Patient's daughter in law, Rivka Barbara, requesting a call back for advise to manage patient's right hip pain due to affecting sleep and naprosyn not helping.  No answer on patient's phone, left message to call clinic back.

## 2021-07-03 NOTE — Telephone Encounter (Signed)
Please advise 

## 2021-07-03 NOTE — Telephone Encounter (Signed)
Rx sent to pharmacy. Referral ordered.  

## 2021-07-03 NOTE — Telephone Encounter (Signed)
Rx sent to pharmacy. Referral ordered.

## 2021-07-03 NOTE — Telephone Encounter (Signed)
Pts daughter called back and asked if nurse will be calling today and she will call back around 3pm if they haven't heard from [facility]/ she asked if pt can be seen asap/ please advise

## 2021-07-03 NOTE — Telephone Encounter (Signed)
Lets refer to [facility].  Sorry, we do not have any appointments. I can offer prednisone 20 mg daily for 5 days or a tramadol every 6 hours as needed if she wants pending the referral

## 2021-07-03 NOTE — Telephone Encounter (Signed)
See TE from today at 14:29. Mrs. Savannah Tucker and daughter returning call. Reviewed Dr. Elisabeth Cara note with them. Patient would like to go ahead with the referral to [facility], please.  She would like the Prednisone 20 mg sent in as well as the Tramadol per Glenda's request in case the Prednisone does not help.  Pharmacy on file.

## 2021-07-03 NOTE — Telephone Encounter (Signed)
Prednisone 20mg  daily for 5 days. Tramadol 50mg  q8 hours prn pain,#35

## 2021-07-03 NOTE — Telephone Encounter (Signed)
LMOVM for pt to return call 

## 2021-07-04 MED ORDER — TRAMADOL HCL 50 MG PO TABS: 50.0000 mg | ORAL_TABLET | Freq: Three times a day (TID) | ORAL | 1 refills | Status: AC | PRN

## 2021-07-08 DIAGNOSIS — H401131 Primary open-angle glaucoma, bilateral, mild stage: Secondary | ICD-10-CM | POA: Diagnosis not present

## 2021-07-08 DIAGNOSIS — H43811 Vitreous degeneration, right eye: Secondary | ICD-10-CM | POA: Diagnosis not present

## 2021-07-15 DIAGNOSIS — M545 Low back pain, unspecified: Secondary | ICD-10-CM | POA: Diagnosis not present

## 2021-07-15 DIAGNOSIS — M5416 Radiculopathy, lumbar region: Secondary | ICD-10-CM | POA: Diagnosis not present

## 2021-07-21 ENCOUNTER — Other Ambulatory Visit: Payer: Self-pay | Admitting: Orthopedic Surgery

## 2021-07-21 ENCOUNTER — Other Ambulatory Visit (HOSPITAL_COMMUNITY): Payer: Self-pay | Admitting: Orthopedic Surgery

## 2021-07-22 ENCOUNTER — Other Ambulatory Visit: Payer: Self-pay | Admitting: Orthopedic Surgery

## 2021-07-22 ENCOUNTER — Ambulatory Visit
Admission: RE | Admit: 2021-07-22 | Discharge: 2021-07-22 | Disposition: A | Discharge status: Home / Self Care | Payer: Self-pay | Source: Ambulatory Visit | Attending: Orthopedic Surgery | Admitting: Orthopedic Surgery

## 2021-07-22 DIAGNOSIS — M5416 Radiculopathy, lumbar region: Secondary | ICD-10-CM

## 2021-07-22 DIAGNOSIS — M545 Low back pain, unspecified: Secondary | ICD-10-CM

## 2021-07-28 NOTE — Progress Notes (Signed)
Patient for Myelogram 07/30/2021, called and spoke with patient on phone with pre procedure instructions given. Made aware to be here @ 0830, Liquids after MN, NPO after 0400, and driver post procedure/recovery/discharge. Stated understanding. Holding ASA started 8/14.

## 2021-07-30 ENCOUNTER — Other Ambulatory Visit: Payer: Self-pay

## 2021-07-30 ENCOUNTER — Ambulatory Visit
Admission: RE | Admit: 2021-07-30 | Discharge: 2021-07-30 | Disposition: A | Discharge status: Home / Self Care | Payer: Medicare Other | Source: Ambulatory Visit | Attending: Orthopedic Surgery | Admitting: Orthopedic Surgery

## 2021-07-30 DIAGNOSIS — M5416 Radiculopathy, lumbar region: Secondary | ICD-10-CM | POA: Diagnosis not present

## 2021-07-30 DIAGNOSIS — M4807 Spinal stenosis, lumbosacral region: Secondary | ICD-10-CM | POA: Diagnosis not present

## 2021-07-30 DIAGNOSIS — M545 Low back pain, unspecified: Secondary | ICD-10-CM

## 2021-07-30 DIAGNOSIS — M5116 Intervertebral disc disorders with radiculopathy, lumbar region: Secondary | ICD-10-CM | POA: Diagnosis not present

## 2021-07-30 DIAGNOSIS — M48061 Spinal stenosis, lumbar region without neurogenic claudication: Secondary | ICD-10-CM | POA: Insufficient documentation

## 2021-07-30 DIAGNOSIS — M5126 Other intervertebral disc displacement, lumbar region: Secondary | ICD-10-CM | POA: Diagnosis not present

## 2021-07-30 DIAGNOSIS — M25551 Pain in right hip: Secondary | ICD-10-CM | POA: Diagnosis not present

## 2021-07-30 HISTORY — DX: Hypothyroidism, unspecified: E03.9

## 2021-07-30 MED ORDER — IOHEXOL 180 MG/ML  SOLN
20.0000 mL | Freq: Once | INTRAMUSCULAR | Status: AC | PRN
  Administered 2021-07-30: 15 mL

## 2021-07-30 MED ORDER — LIDOCAINE HCL (PF) 1 % IJ SOLN
5.0000 mL | Freq: Once | INTRAMUSCULAR | Status: AC
  Administered 2021-07-30: 5 mL
  Filled 2021-07-30: qty 5

## 2021-07-30 MED ORDER — ACETAMINOPHEN 325 MG PO TABS
650.0000 mg | ORAL_TABLET | ORAL | Status: DC | PRN
  Filled 2021-07-30: qty 2

## 2021-07-30 NOTE — Procedures (Signed)
PROCEDURE SUMMARY:  Successful fluoroscopic guided Lumbar Myelogram. No immediate complications.  Pt tolerated well.   Patient will get additional imaging in CT.  EBL < 72mL  Pattricia Boss D PA-C 07/30/2021 10:13 AM

## 2021-08-11 DIAGNOSIS — H43811 Vitreous degeneration, right eye: Secondary | ICD-10-CM | POA: Diagnosis not present

## 2021-08-22 DIAGNOSIS — M5416 Radiculopathy, lumbar region: Secondary | ICD-10-CM | POA: Diagnosis not present

## 2021-08-25 ENCOUNTER — Telehealth: Payer: Self-pay

## 2021-08-25 NOTE — Progress Notes (Signed)
Outreach to schedule [employer] Visit (AWV) on  Saturday Sept 17,2022 to do by phone or video     Last AWV 06/20/2020  Patient declines rescheduling AWV, Patient states that she has too many appts to keep up with and she does not need this.      Any questions, please call me at   Penne Lash, RMA Care Guide, Embedded Care Coordination [facility]  [place], [place] [address] Direct Dial: [phone] [email or web address]: [email or web address]

## 2021-09-02 DIAGNOSIS — I495 Sick sinus syndrome: Secondary | ICD-10-CM | POA: Diagnosis not present

## 2021-09-16 DIAGNOSIS — I10 Essential (primary) hypertension: Secondary | ICD-10-CM | POA: Diagnosis not present

## 2021-09-16 DIAGNOSIS — I495 Sick sinus syndrome: Secondary | ICD-10-CM | POA: Diagnosis not present

## 2021-09-16 DIAGNOSIS — I38 Endocarditis, valve unspecified: Secondary | ICD-10-CM | POA: Diagnosis not present

## 2021-09-16 DIAGNOSIS — Z950 Presence of cardiac pacemaker: Secondary | ICD-10-CM | POA: Diagnosis not present

## 2021-09-16 DIAGNOSIS — I482 Chronic atrial fibrillation, unspecified: Secondary | ICD-10-CM | POA: Diagnosis not present

## 2021-09-17 DIAGNOSIS — M5416 Radiculopathy, lumbar region: Secondary | ICD-10-CM | POA: Diagnosis not present

## 2021-10-01 DIAGNOSIS — I495 Sick sinus syndrome: Secondary | ICD-10-CM | POA: Diagnosis not present

## 2021-10-03 DIAGNOSIS — M5416 Radiculopathy, lumbar region: Secondary | ICD-10-CM | POA: Diagnosis not present

## 2021-10-14 DIAGNOSIS — Z23 Encounter for immunization: Secondary | ICD-10-CM | POA: Diagnosis not present

## 2021-10-23 ENCOUNTER — Ambulatory Visit (INDEPENDENT_AMBULATORY_CARE_PROVIDER_SITE_OTHER): Payer: Medicare Other | Admitting: Family Medicine

## 2021-10-23 ENCOUNTER — Encounter: Payer: Self-pay | Admitting: Family Medicine

## 2021-10-23 ENCOUNTER — Other Ambulatory Visit: Payer: Self-pay

## 2021-10-23 VITALS — BP 175/98 | HR 71 | Temp 98.3°F | Wt 117.0 lb

## 2021-10-23 DIAGNOSIS — I495 Sick sinus syndrome: Secondary | ICD-10-CM | POA: Diagnosis not present

## 2021-10-23 DIAGNOSIS — E039 Hypothyroidism, unspecified: Secondary | ICD-10-CM | POA: Diagnosis not present

## 2021-10-23 DIAGNOSIS — I482 Chronic atrial fibrillation, unspecified: Secondary | ICD-10-CM | POA: Diagnosis not present

## 2021-10-23 DIAGNOSIS — Z950 Presence of cardiac pacemaker: Secondary | ICD-10-CM

## 2021-10-23 DIAGNOSIS — M8580 Other specified disorders of bone density and structure, unspecified site: Secondary | ICD-10-CM | POA: Diagnosis not present

## 2021-10-23 DIAGNOSIS — E559 Vitamin D deficiency, unspecified: Secondary | ICD-10-CM

## 2021-10-23 DIAGNOSIS — M810 Age-related osteoporosis without current pathological fracture: Secondary | ICD-10-CM

## 2021-10-23 DIAGNOSIS — I10 Essential (primary) hypertension: Secondary | ICD-10-CM

## 2021-10-23 DIAGNOSIS — M5416 Radiculopathy, lumbar region: Secondary | ICD-10-CM

## 2021-10-23 DIAGNOSIS — J309 Allergic rhinitis, unspecified: Secondary | ICD-10-CM

## 2021-10-23 MED ORDER — OMEPRAZOLE 20 MG PO CPDR: 20.0000 mg | DELAYED_RELEASE_CAPSULE | ORAL | 5 refills | Status: DC

## 2021-10-23 NOTE — Progress Notes (Signed)
Established patient visit   Patient: Savannah Tucker   DOB: 07-09-1930   87 y.o. Female  MRN: [id number] Visit Date: 10/23/2021  Today's healthcare provider: Megan Mans, MD   Chief Complaint  Patient presents with   Back Pain   Subjective    Back Pain This is a chronic problem. Episode onset: Started May 2022. The problem has been gradually worsening since onset. The pain is present in the lumbar spine. Radiates to: Down both legs. Pertinent negatives include no abdominal pain, chest pain, fever or weakness.   Seems to bother her right leg more than her left.  Patient is a widower who has 3 sons 3 grandchildren and 2 great-grandchildren.  She lives independently and does well. Started to be slowed down by the chronic low back pain. Hypertension, follow-up  BP Readings from Last 3 Encounters:  10/23/21 (!) 175/98  07/30/21 (!) 165/92  04/22/21 (!) 166/79   Wt Readings from Last 3 Encounters:  10/23/21 117 lb (53.1 kg)  07/30/21 123 lb (55.8 kg)  04/22/21 123 lb (55.8 kg)     She was last seen for hypertension 6 months ago.  BP at that visit was 166/79. Management since that visit includes; Good control on amlodipine. She reports excellent compliance with treatment. She is not having side effects.  She is not exercising. She is adherent to low salt diet.   Outside blood pressures are checked occasionally, sometimes elevated due to back pain.  She does not smoke.  Use of agents associated with hypertension: none.   --------------------------------------------------------------------------------------------------- Hypothyroid, follow-up  Lab Results  Component Value Date   TSH 2.230 04/22/2021   TSH 2.010 06/20/2020   TSH 2.250 06/06/2019   Wt Readings from Last 3 Encounters:  10/23/21 117 lb (53.1 kg)  07/30/21 123 lb (55.8 kg)  04/22/21 123 lb (55.8 kg)    She was last seen for hypothyroid 6 months ago.  Management since that visit includes;  labs checked showing-stable. She reports excellent compliance with treatment. She is not having side effects.   -----------------------------------------------------------------------------------------      Medications: Outpatient Medications Prior to Visit  Medication Sig   acetaminophen (TYLENOL) 500 MG tablet Take 1,000 mg by mouth every 6 (six) hours as needed for moderate pain.   amLODipine (NORVASC) 5 MG tablet Take 1 tablet (5 mg total) by mouth daily.   aspirin 325 MG tablet Take 325 mg by mouth.    calcium carbonate (OS-CAL) 600 MG TABS tablet Take 1,200 mg by mouth daily.    cetirizine (ZYRTEC) 10 MG tablet Take 1 tablet (10 mg total) by mouth daily.   levothyroxine (SYNTHROID) 50 MCG tablet TAKE 1 TABLET EVERY DAY ON EMPTY STOMACHWITH A GLASS OF WATER AT LEAST 30-60 MINBEFORE BREAKFAST   timolol (TIMOPTIC) 0.5 % ophthalmic solution Place 1 drop into both eyes daily.    naproxen (NAPROSYN) 375 MG tablet Take 1 tablet (375 mg total) by mouth 2 (two) times daily with a meal. (Patient not taking: Reported on 10/23/2021)   predniSONE (DELTASONE) 20 MG tablet Take 1 tablet (20 mg total) by mouth daily with breakfast. (Patient not taking: Reported on 10/23/2021)   No facility-administered medications prior to visit.    Review of Systems  Constitutional:  Negative for appetite change, chills, fatigue and fever.  Respiratory:  Negative for chest tightness and shortness of breath.   Cardiovascular:  Negative for chest pain and palpitations.  Gastrointestinal:  Negative for abdominal pain, nausea  and vomiting.  Musculoskeletal:  Positive for back pain.  Neurological:  Negative for dizziness and weakness.      Objective    BP (!) 175/98 (BP Location: Right Arm, Patient Position: Sitting, Cuff Size: Normal)   Pulse 71   Temp 98.3 F (36.8 C) (Temporal)   Wt 117 lb (53.1 kg)   SpO2 99%   BMI 20.73 kg/m    Physical Exam Vitals reviewed.  Constitutional:      Appearance:  Normal appearance.  HENT:     Head: Normocephalic and atraumatic.  Eyes:     Extraocular Movements: Extraocular movements intact.     Conjunctiva/sclera: Conjunctivae normal.     Pupils: Pupils are equal, round, and reactive to light.  Neck:     Vascular: No carotid bruit.  Cardiovascular:     Rate and Rhythm: Normal rate and regular rhythm.     Pulses: Normal pulses.     Heart sounds: Normal heart sounds.  Pulmonary:     Effort: Pulmonary effort is normal.     Breath sounds: Normal breath sounds.  Abdominal:     General: Bowel sounds are normal.     Palpations: Abdomen is soft.     Tenderness: There is no abdominal tenderness.  Musculoskeletal:     Cervical back: No tenderness.     Right lower leg: No edema.     Left lower leg: No edema.  Lymphadenopathy:     Cervical: No cervical adenopathy.  Skin:    General: Skin is warm and dry.  Neurological:     Mental Status: She is alert and oriented to person, place, and time. Mental status is at baseline.     Comments: Straight leg raise is negative bilaterally and strength is normal in both lower extremities with normal DTR  Psychiatric:        Mood and Affect: Mood normal.        Behavior: Behavior normal.        Thought Content: Thought content normal.        Judgment: Judgment normal.      No results found for any visits on 10/23/21.  Assessment & Plan     1. Benign essential HTN Patient says she has whitecoat hypertension.  Follow with home readings. - CBC w/Diff/Platelet - Comprehensive Metabolic Panel (CMET) - TSH  2. Adult hypothyroidism  - CBC w/Diff/Platelet - Comprehensive Metabolic Panel (CMET) - TSH  3. Allergic rhinitis, unspecified seasonality, unspecified trigger  - CBC w/Diff/Platelet - Comprehensive Metabolic Panel (CMET) - TSH  4. Lumbar radiculopathy Will try carefully meloxicam again and protect her GI tract with omeprazole while she is on it. She may benefit a lot by physical therapy  referral.  She is also ready seeing Dr. Noralyn Pick who is a spine surgeon - CBC w/Diff/Platelet - Comprehensive Metabolic Panel (CMET) - TSH - omeprazole (PRILOSEC) 20 MG capsule; Take 1 capsule (20 mg total) by mouth every morning.  Dispense: 30 capsule; Refill: 5  5. Osteoporosis, unspecified osteoporosis type, unspecified pathological fracture presence  - CBC w/Diff/Platelet - Comprehensive Metabolic Panel (CMET) - TSH - omeprazole (PRILOSEC) 20 MG capsule; Take 1 capsule (20 mg total) by mouth every morning.  Dispense: 30 capsule; Refill: 5   Return in about 6 months (around 04/22/2022).      I, Megan Mans, MD, have reviewed all documentation for this visit. The documentation on 10/26/21 for the exam, diagnosis, procedures, and orders are all accurate and complete.    Tiani Stanbery  Cranford Mon, MD  [facility] [phone] (phone) [phone] (fax)  [place]

## 2021-10-23 NOTE — Patient Instructions (Signed)
TRY MELOXICAM AGAIN. TAKE OMEPRAZOLE AS PRESCRIBED TO HELP WITH GASTRIC SIDE EFFECTS FROM TAKING MELOXICAM.

## 2021-10-24 LAB — COMPREHENSIVE METABOLIC PANEL
ALT: 16 IU/L (ref 0–32)
AST: 19 IU/L (ref 0–40)
Albumin/Globulin Ratio: 1.8 (ref 1.2–2.2)
Albumin: 4.6 g/dL (ref 3.6–4.6)
Alkaline Phosphatase: 157 IU/L — ABNORMAL HIGH (ref 44–121)
BUN/Creatinine Ratio: 23 (ref 12–28)
BUN: 18 mg/dL (ref 8–27)
Bilirubin Total: 0.5 mg/dL (ref 0.0–1.2)
CO2: 24 mmol/L (ref 20–29)
Calcium: 9.9 mg/dL (ref 8.7–10.3)
Chloride: 92 mmol/L — ABNORMAL LOW (ref 96–106)
Creatinine, Ser: 0.8 mg/dL (ref 0.57–1.00)
Globulin, Total: 2.6 g/dL (ref 1.5–4.5)
Glucose: 102 mg/dL — ABNORMAL HIGH (ref 70–99)
Potassium: 4.3 mmol/L (ref 3.5–5.2)
Sodium: 132 mmol/L — ABNORMAL LOW (ref 134–144)
Total Protein: 7.2 g/dL (ref 6.0–8.5)
eGFR: 70 mL/min/{1.73_m2} (ref >59)

## 2021-10-24 LAB — CBC WITH DIFFERENTIAL/PLATELET
Basophils Absolute: 0.1 10*3/uL (ref 0.0–0.2)
Basos: 1 %
EOS (ABSOLUTE): 0.2 10*3/uL (ref 0.0–0.4)
Eos: 3 %
Hematocrit: 40.4 % (ref 34.0–46.6)
Hemoglobin: 13.5 g/dL (ref 11.1–15.9)
Immature Grans (Abs): 0 10*3/uL (ref 0.0–0.1)
Immature Granulocytes: 0 %
Lymphocytes Absolute: 1.4 10*3/uL (ref 0.7–3.1)
Lymphs: 22 %
MCH: 32.7 pg (ref 26.6–33.0)
MCHC: 33.4 g/dL (ref 31.5–35.7)
MCV: 98 fL — ABNORMAL HIGH (ref 79–97)
Monocytes Absolute: 0.5 10*3/uL (ref 0.1–0.9)
Monocytes: 8 %
Neutrophils Absolute: 4.3 10*3/uL (ref 1.4–7.0)
Neutrophils: 66 %
Platelets: 361 10*3/uL (ref 150–450)
RBC: 4.13 x10E6/uL (ref 3.77–5.28)
RDW: 12.6 % (ref 11.7–15.4)
WBC: 6.6 10*3/uL (ref 3.4–10.8)

## 2021-10-24 LAB — TSH: TSH: 1.66 u[IU]/mL (ref 0.450–4.500)

## 2021-11-10 DIAGNOSIS — M461 Sacroiliitis, not elsewhere classified: Secondary | ICD-10-CM | POA: Diagnosis not present

## 2021-11-10 DIAGNOSIS — R278 Other lack of coordination: Secondary | ICD-10-CM | POA: Diagnosis not present

## 2021-11-28 DIAGNOSIS — R2689 Other abnormalities of gait and mobility: Secondary | ICD-10-CM | POA: Diagnosis not present

## 2021-11-28 DIAGNOSIS — M545 Low back pain, unspecified: Secondary | ICD-10-CM | POA: Diagnosis not present

## 2021-12-01 DIAGNOSIS — M545 Low back pain, unspecified: Secondary | ICD-10-CM | POA: Diagnosis not present

## 2021-12-01 DIAGNOSIS — R2689 Other abnormalities of gait and mobility: Secondary | ICD-10-CM | POA: Diagnosis not present

## 2021-12-10 DIAGNOSIS — M461 Sacroiliitis, not elsewhere classified: Secondary | ICD-10-CM | POA: Diagnosis not present

## 2021-12-17 DIAGNOSIS — M545 Low back pain, unspecified: Secondary | ICD-10-CM | POA: Diagnosis not present

## 2021-12-17 DIAGNOSIS — R2689 Other abnormalities of gait and mobility: Secondary | ICD-10-CM | POA: Diagnosis not present

## 2021-12-24 DIAGNOSIS — M545 Low back pain, unspecified: Secondary | ICD-10-CM | POA: Diagnosis not present

## 2021-12-24 DIAGNOSIS — R2689 Other abnormalities of gait and mobility: Secondary | ICD-10-CM | POA: Diagnosis not present

## 2022-02-11 DIAGNOSIS — H401131 Primary open-angle glaucoma, bilateral, mild stage: Secondary | ICD-10-CM | POA: Diagnosis not present

## 2022-02-19 DIAGNOSIS — H401131 Primary open-angle glaucoma, bilateral, mild stage: Secondary | ICD-10-CM | POA: Diagnosis not present

## 2022-02-24 DIAGNOSIS — I495 Sick sinus syndrome: Secondary | ICD-10-CM | POA: Diagnosis not present

## 2022-04-13 ENCOUNTER — Ambulatory Visit (LOCAL_COMMUNITY_HEALTH_CENTER): Payer: Medicare Other

## 2022-04-13 DIAGNOSIS — Z23 Encounter for immunization: Secondary | ICD-10-CM

## 2022-04-13 NOTE — Progress Notes (Signed)
?  Are you feeling sick today? No ? ? ?Have you ever received a dose of COVID-19 Vaccine? [employer], [place], [place], [place], Other) Yes ? ?If yes, which vaccine and how many doses?   4 ? ? ?Did you bring the vaccination record card or other documentation?  Yes ? ? ?Do you have a health condition or are undergoing treatment that makes you moderately or severely immunocompromised? This would include, but not be limited to: cancer, HIV, organ transplant, immunosuppressive therapy/high-dose corticosteroids, or moderate/severe primary immunodeficiency.  No ? ?Have you received COVID-19 vaccine before or during hematopoietic cell transplant (HCT) or CAR-T-cell therapies? No ? ?Have you ever had an allergic reaction to: (This would include a severe allergic reaction or a reaction that caused hives, swelling, or respiratory distress, including wheezing.) A component of a COVID-19 vaccine or a previous dose of COVID-19 vaccine? No ? ? ?Have you ever had an allergic reaction to another vaccine (other thanCOVID-19 vaccine) or an injectable medication? (This would include a severe allergic reaction or a reaction that caused hives, swelling, or respiratory distress, including wheezing.)   No ?  ?Do you have a history of any of the following: ? ?Myocarditis or Pericarditis No ?Thrombosis with thrombocytopenia syndrome (TTS) No ?Multisystem Inflammatory Syndrome (MIS-C or MIS-A)? No ?Immune-mediate syndrome defined by thrombosis and thrombocytopenia, such as heparin--induced thrombocytopenia (HIT)  No ?Guillain-Barr? Syndrome (GBS) No ?COVID-19 disease within the past 3 months? No ?Vaccinated with monkeypox vaccine in the last 4 weeks? No ?  ?Pfizer BV booster administered in left deltoid; tolerated well.  Vaccine card updated, [facility] updated, copy of record given to pt.  Declined VIS. ?Tonny Branch, RN  ? ?

## 2022-04-16 IMAGING — RF DG MYELOGRAM LUMBAR
5 series · 8 of 8 positions shown · non-contrast
Comparison: None

CLINICAL DATA: Patient complains of lower back pain radiating to
the right posterior hip and thigh. Request received for lumbar
myelogram with CT to follow, patient is unable to have an MRI
secondary to pacemaker.

[Series 1: cp_standard · 0.19mm/px · 1 of 1 slices shown (1 of 4)]
[im 1/1]
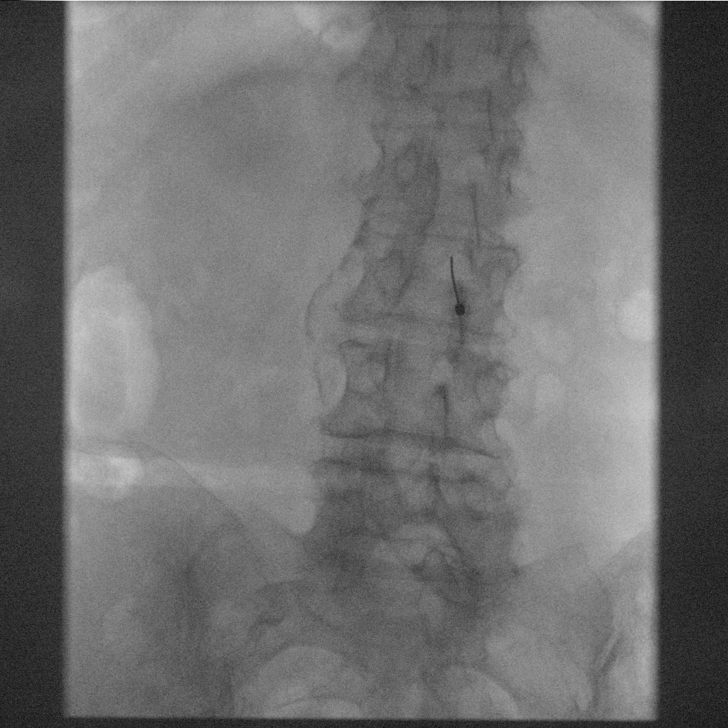

[Series 2: cp_standard · 0.19mm/px · 1 of 1 slices shown (2 of 4)]
[im 1/1]
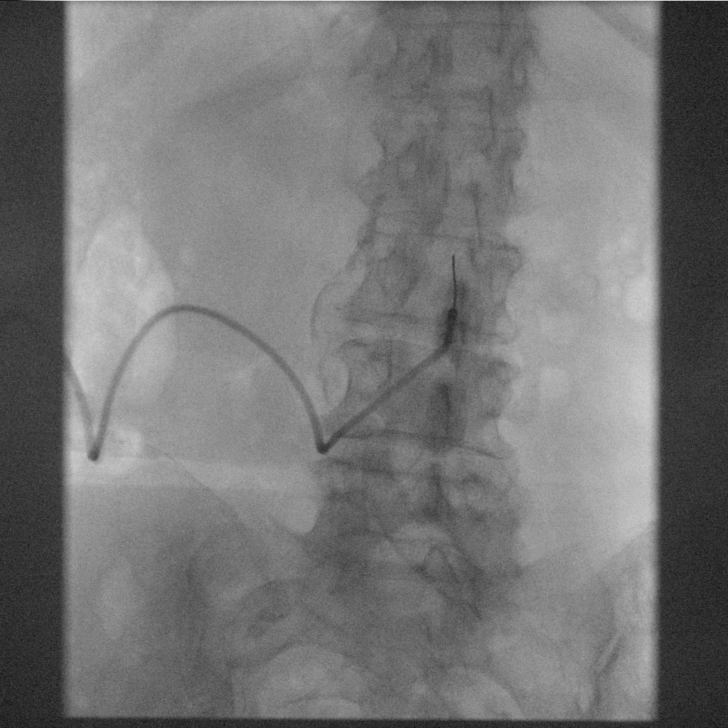

[Series 3: cp_standard · 0.19mm/px · 4 of 69 frames shown (3 of 4)]
[frame 2/69]
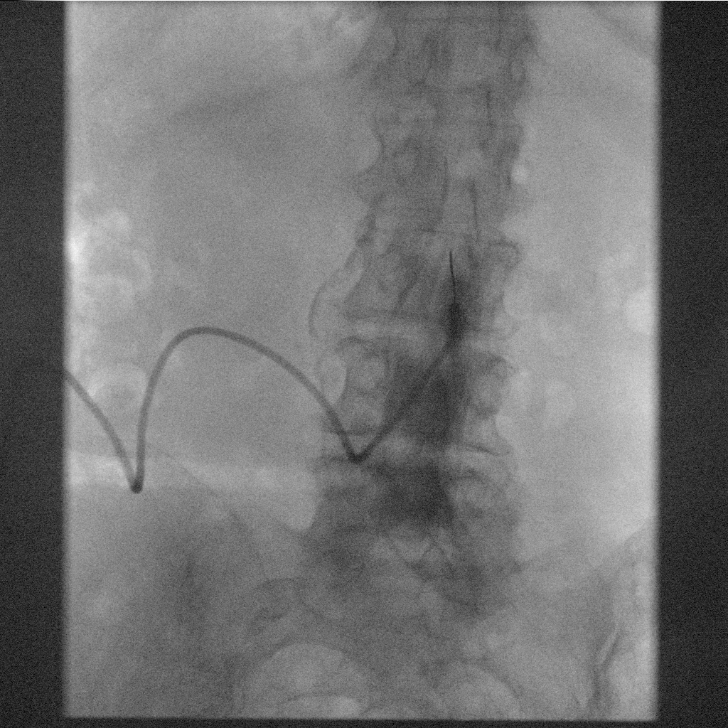
[frame 11/69]
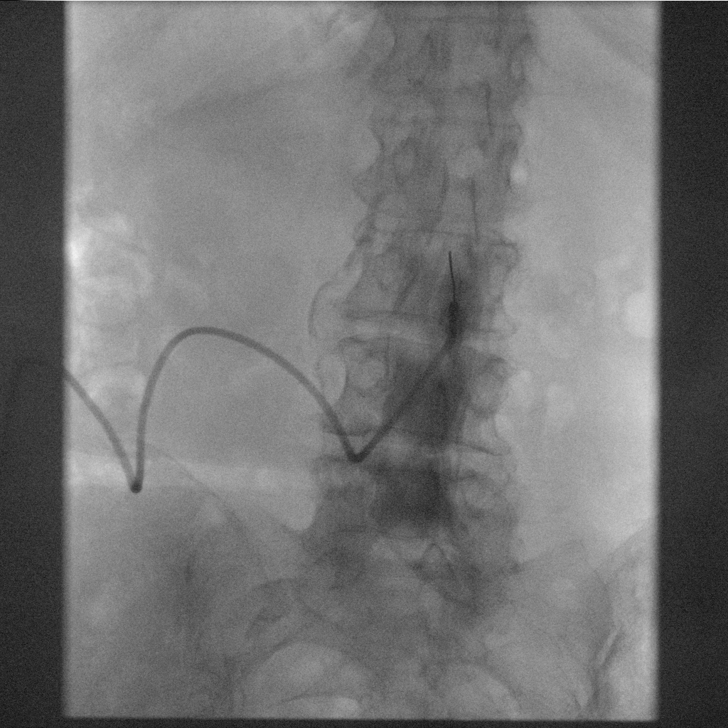
[frame 35/69]
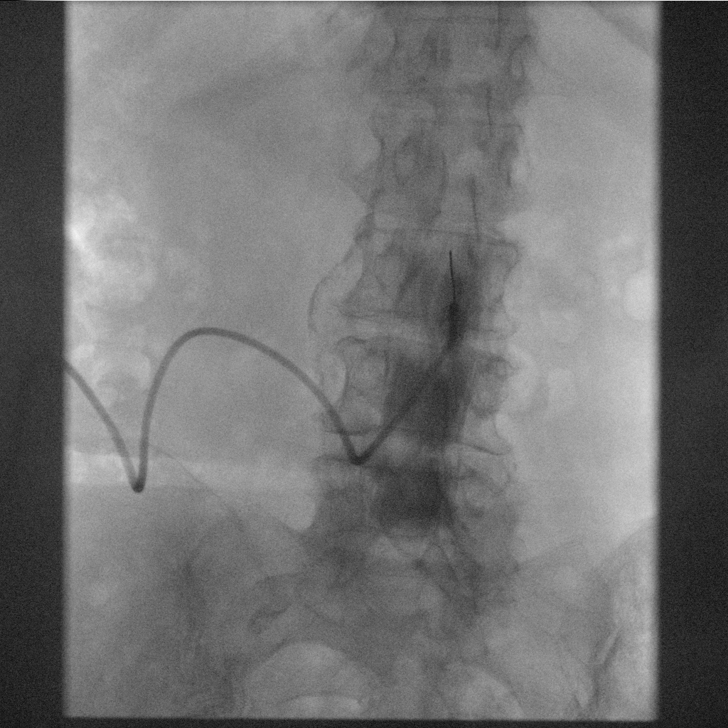
[frame 59/69]
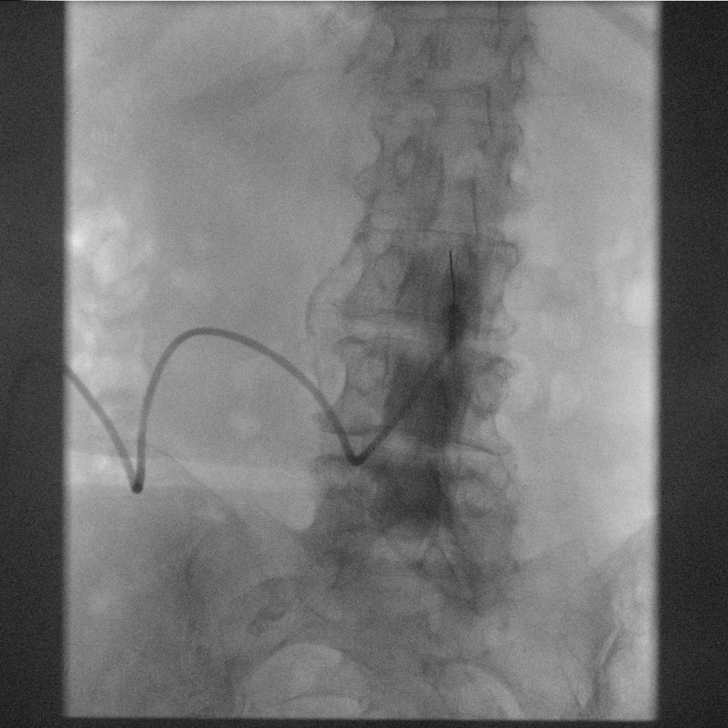

[Series 4: cp_standard · 0.19mm/px · 1 of 1 slices shown (4 of 4)]
[im 1/1]
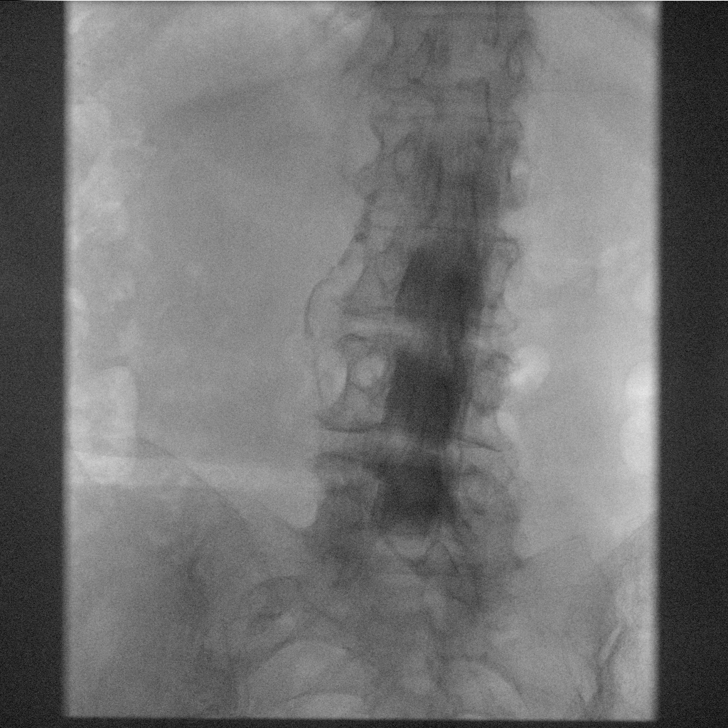

[Series 5: fluoro_myelogram_singleshot_bw · 0.19mm/px · 1 of 1 slices shown]
[im 1/1]
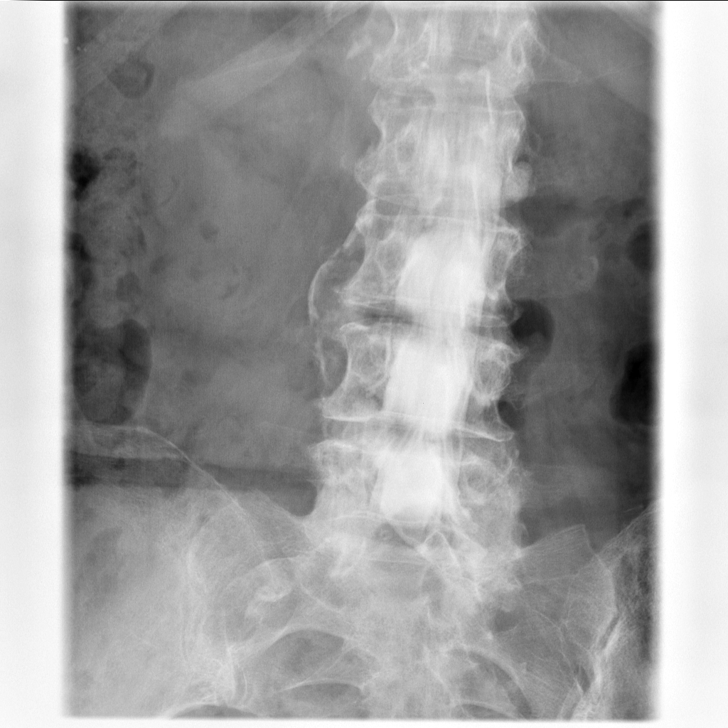

[8 of 8 positions shown; findings below may reference images not displayed]

EXAM:
LUMBAR MYELOGRAM

FLUOROSCOPY TIME:  42 seconds

PROCEDURE:
After thorough discussion of risks and benefits of the procedure
including bleeding, infection, injury to nerves, blood vessels,
adjacent structures as well as headache and CSF leak, written and
oral informed consent was obtained. Consent was obtained by Nyavi
Pryce. Time out form was completed.

Patient was positioned prone on the fluoroscopy table. Local
anesthesia was provided with 1% lidocaine after prepped and draped
in the usual sterile fashion. Puncture was performed at L2-L3 using
a 3 1/2 inch 22-gauge spinal needle. Using a single pass through the
dura, the needle was placed within the thecal sac, with return of
clear CSF. 15 mL of Omnipaque 180 was injected into the thecal sac,
with normal opacification of the nerve roots and cauda equina
consistent with free flow within the subarachnoid space. The stylet
was placed in the needle and the needle was removed in its entirety
with a sterile bandage placed. No immediate complications.

I personally performed the lumbar puncture and administered the
intrathecal contrast. I also personally supervised acquisition of
the myelogram images.
FINDINGS: Successful lumbar myelogram.
IMPRESSION: Technically successful lumbar myelogram.

## 2022-04-16 IMAGING — CT CT L SPINE W/ CM
3 of 4 series · 13 of 33 positions shown, 16 images · IV contrast (omnipaque)
Comparison: None

CLINICAL DATA: Low back, right posterior hip and thigh pain.

EXAM:
CT LUMBAR SPINE WITH INTRATHECAL CONTRAST
FLUOROSCOPY TIME:  CT myelogram report
TECHNIQUE: Omnipaque 180 were administered intrathecally for lumbar
myelography, followed by axial CT scanning of the lumbar spine. See
previous myelogram report. Coronal and sagittal reconstructions were
generated from the axial scan.

[Series 5: coronal bone · coronal · 0.41mm/px · 3 of 81 slices shown]
[im 17/81  bone]
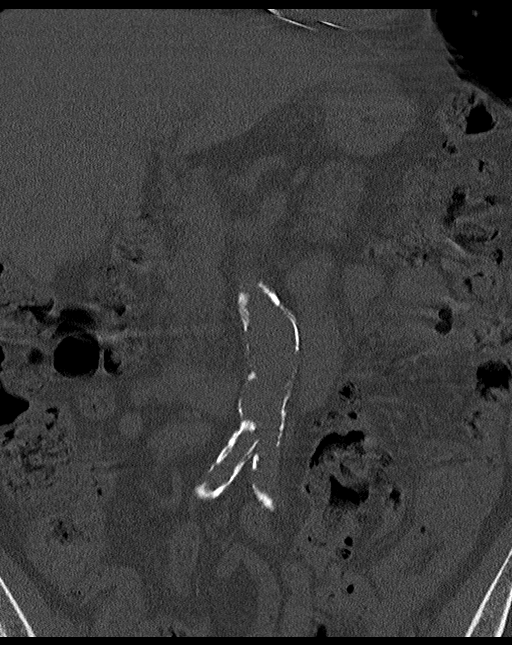
[im 33/81  bone]
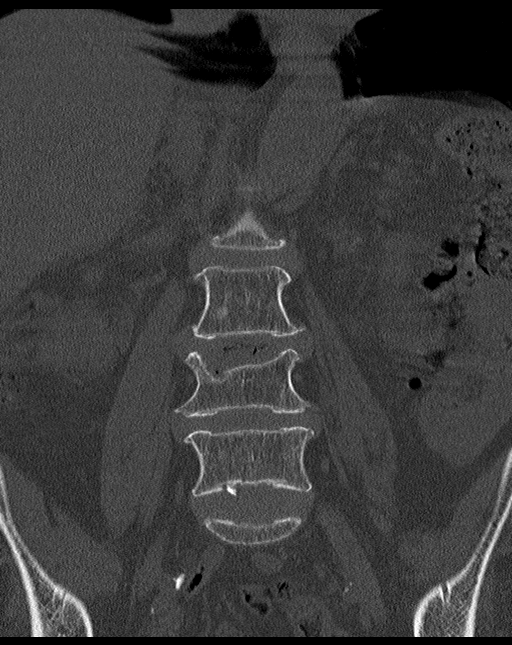
[im 49/81  bone]
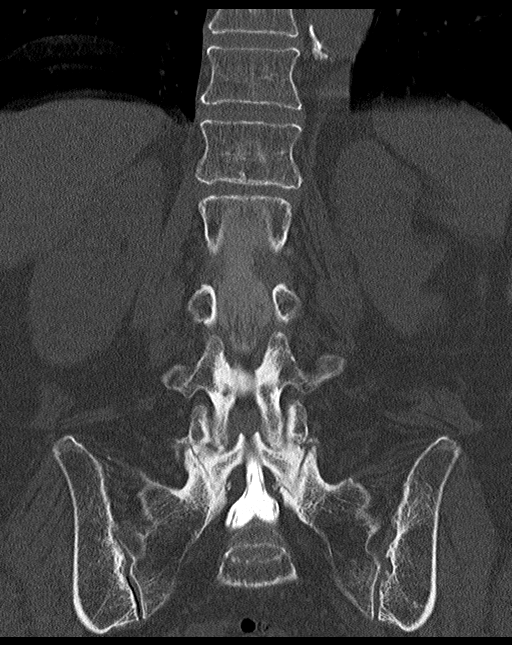

[Series 6: sagittal st · sagittal · 0.38mm/px · 5 of 71 slices shown, 6 images]
[im 24/71  bone]
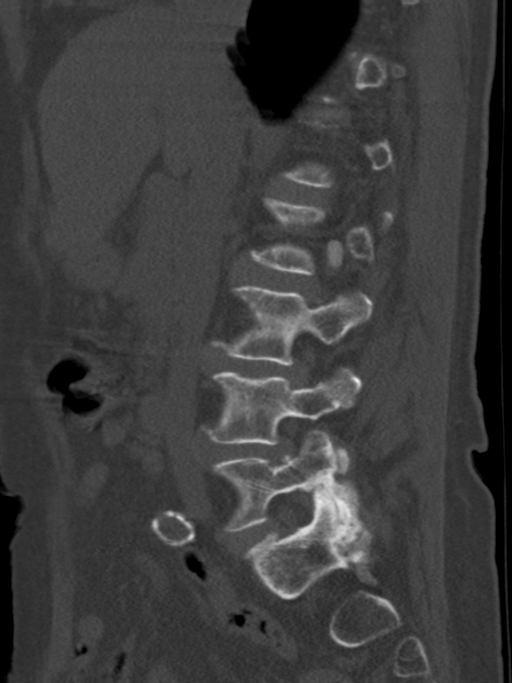
[im 30/71  bone]
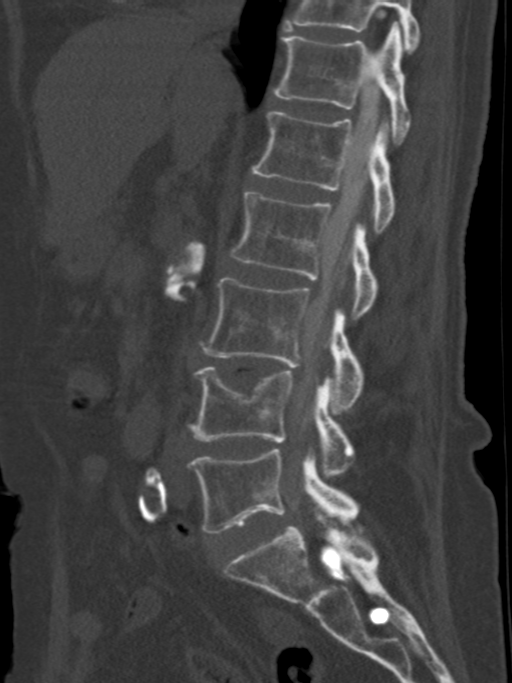
[im 36/71  soft-tissue]
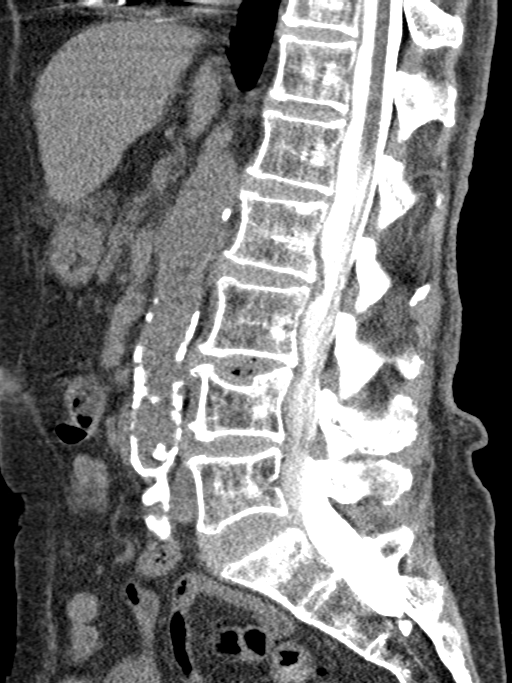
[im 36/71  bone]
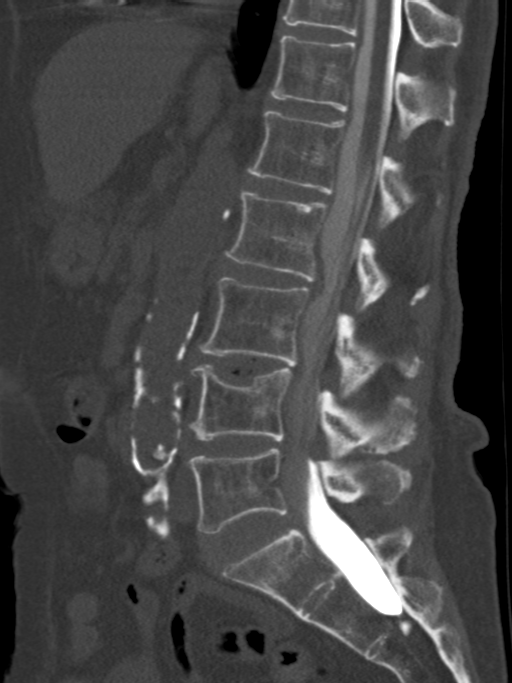
[im 41/71  bone]
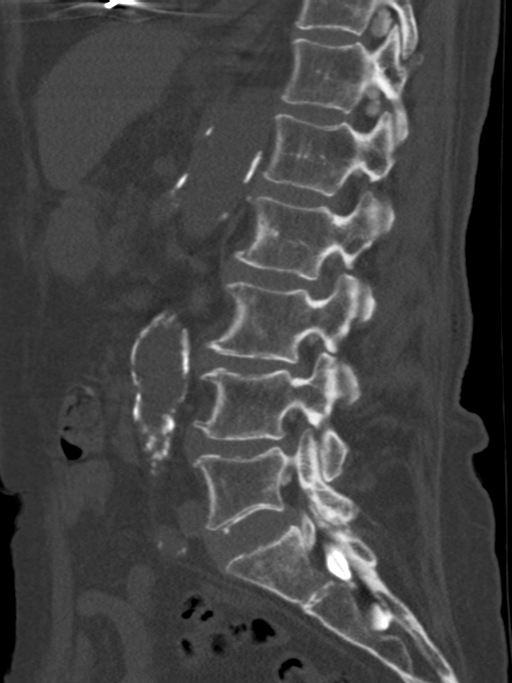
[im 47/71  bone]
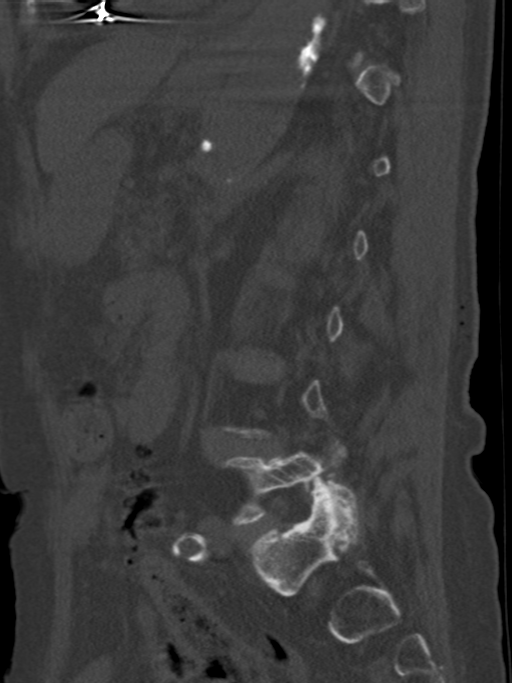

[Series 8: multi disc · axial · 0.21mm/px · z∈[-371,-181]mm · 5 of 118 slices shown, 7 images]
[im 20/118  soft-tissue]
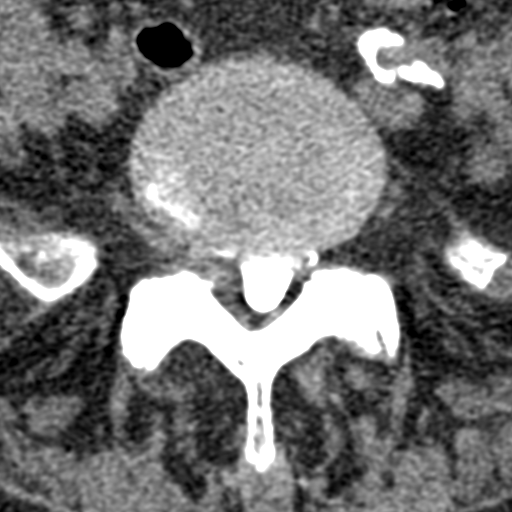
[im 20/118  bone]
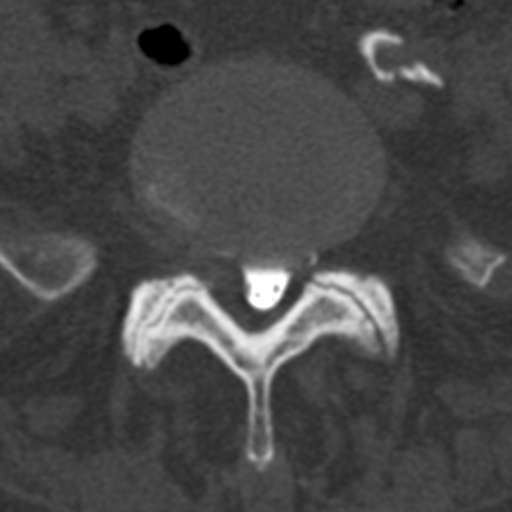
[im 40/118  bone]
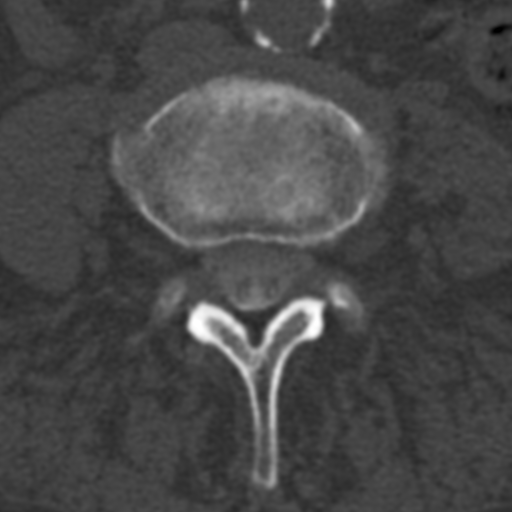
[im 59/118  bone]
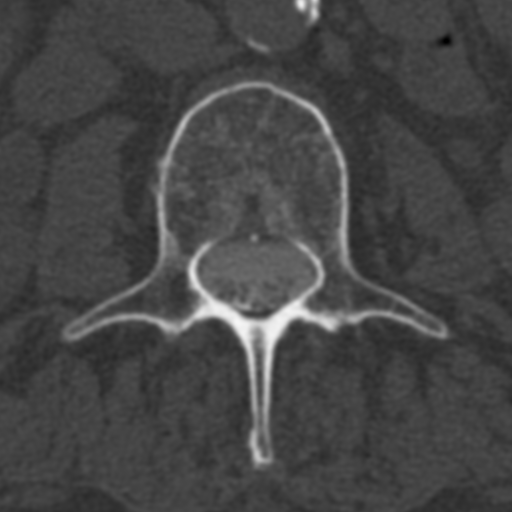
[im 79/118  bone]
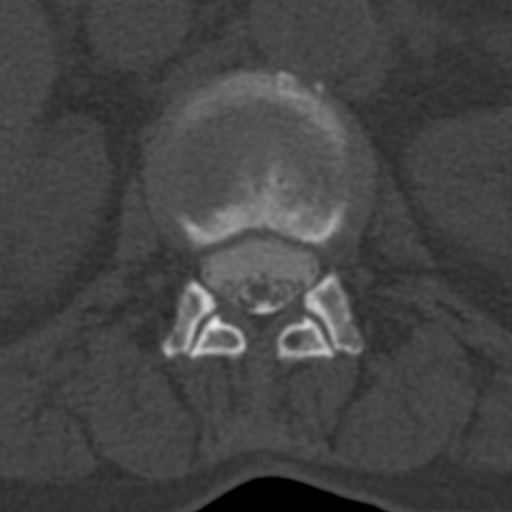
[im 98/118  soft-tissue]
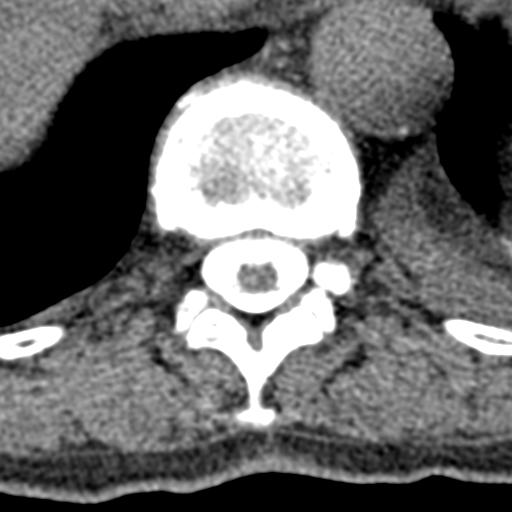
[im 98/118  bone]
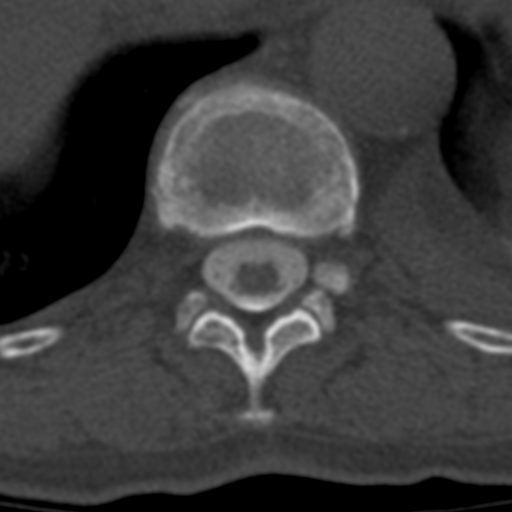

[13 of 33 positions shown; findings below may reference images not displayed]

FINDINGS: 5 non-rib-bearing lumbar segments assigned L1-L5.  Normal alignment.

T12-L1: Minimal posterior disc bulge. No spinal stenosis. Foramina
patent.

L1-2: Minimal disc bulge. No spinal stenosis. Foramina patent. Conus
terminates behind L2.

L2-3: Broad left paracentral and foraminal protrusion. Mild
narrowing of the left lateral recess. No spinal stenosis. There is
left foraminal encroachment.

L3-4: Broad Schmorl's node involves superior endplate of L4.
Moderate circumferential disc bulge. Early facet DJD with some
thickening of ligamentum flavum result in mild narrowing of the
spinal canal in the AP dimension, and bilateral subarticular recess
encroachment. Foramina remain patent.

L4-5: Moderate circumferential disc bulge. Mild facet DJD and
thickening of ligamentum flavum contributing to mild central canal
stenosis and narrowing of subarticular recesses. Foramina patent.

L5-S1: Mild disc bulge. Moderate facet DJD right worse than left,
with mild spinal stenosis and right subarticular recess
encroachment. No spinal stenosis.

Heavily calcified aortoiliac plaque without aneurysm. Transvenous
pacing lead partially visualized. Small left pleural effusion.
Visualized paraspinal soft tissues otherwise unremarkable.
IMPRESSION: 1. Mild disc bulges T12-L1 and L1-2 without compressive pathology.
2. Left paracentral protrusion L2-3 with mild left lateral recess
and foraminal narrowing.
3. Mild multifactorial spinal stenosis and subarticular recess
narrowing L3-4 and L4-5.
4. Mild multifactorial spinal stenosis L5-S1 with right subarticular
recess encroachment related to facet DJD.

## 2022-04-27 ENCOUNTER — Other Ambulatory Visit: Payer: Self-pay | Admitting: Family Medicine

## 2022-04-27 DIAGNOSIS — J309 Allergic rhinitis, unspecified: Secondary | ICD-10-CM

## 2022-05-07 ENCOUNTER — Ambulatory Visit (INDEPENDENT_AMBULATORY_CARE_PROVIDER_SITE_OTHER): Payer: Medicare Other | Admitting: Family Medicine

## 2022-05-07 ENCOUNTER — Encounter: Payer: Self-pay | Admitting: Family Medicine

## 2022-05-07 VITALS — BP 130/72 | HR 70 | Temp 97.8°F | Resp 16 | Ht 63.0 in | Wt 113.4 lb

## 2022-05-07 DIAGNOSIS — M5431 Sciatica, right side: Secondary | ICD-10-CM | POA: Diagnosis not present

## 2022-05-07 DIAGNOSIS — J309 Allergic rhinitis, unspecified: Secondary | ICD-10-CM | POA: Diagnosis not present

## 2022-05-07 DIAGNOSIS — Z950 Presence of cardiac pacemaker: Secondary | ICD-10-CM

## 2022-05-07 DIAGNOSIS — Z0000 Encounter for general adult medical examination without abnormal findings: Secondary | ICD-10-CM | POA: Diagnosis not present

## 2022-05-07 DIAGNOSIS — E039 Hypothyroidism, unspecified: Secondary | ICD-10-CM | POA: Diagnosis not present

## 2022-05-07 DIAGNOSIS — I482 Chronic atrial fibrillation, unspecified: Secondary | ICD-10-CM

## 2022-05-07 DIAGNOSIS — I10 Essential (primary) hypertension: Secondary | ICD-10-CM

## 2022-05-07 DIAGNOSIS — M5416 Radiculopathy, lumbar region: Secondary | ICD-10-CM

## 2022-05-07 NOTE — Progress Notes (Signed)
Annual Wellness Visit  I,April Miller,acting as a scribe for Megan Mansichard Briena Swingler Jr, MD.,have documented all relevant documentation on the behalf of Megan MansRichard Yaelis Scharfenberg Jr, MD,as directed by  Megan Mansichard Meghann Landing Jr, MD while in the presence of Megan Mansichard Velton Roselle Jr, MD.     Patient: Savannah Tucker, Female    DOB: 11/23/1930, 88 y.o.   MRN: [id number] Visit Date: 05/07/2022  Today's Provider: Megan Mansichard Deangela Randleman Jr, MD   Chief Complaint  Patient presents with   Medicare Wellness   Subjective    Savannah Tucker is a 88 y.o. female who presents today for her Annual Wellness Visit. She reports consuming a general diet. The patient does not participate in regular exercise at present. She generally feels fairly well. She reports sleeping fairly well. She does not have additional problems to discuss today.  She has been slow to recover since her problems with sciatica earlier this year.  She did have a full injection by [facility] Dr. Donette Larryoone which helped her.  He has not quite gotten her appetite back but she states she is doing okay.  HPI    Medications: Outpatient Medications Prior to Visit  Medication Sig   acetaminophen (TYLENOL) 500 MG tablet Take 1,000 mg by mouth every 6 (six) hours as needed for moderate pain.   aspirin 325 MG tablet Take 325 mg by mouth.    calcium carbonate (OS-CAL) 600 MG TABS tablet Take 1,200 mg by mouth daily.    cetirizine (ZYRTEC) 10 MG tablet TAKE 1 TABLET BY MOUTH DAILY.   levothyroxine (SYNTHROID) 50 MCG tablet TAKE 1 TABLET EVERY DAY ON EMPTY STOMACHWITH A GLASS OF WATER AT LEAST 30-60 MINBEFORE BREAKFAST   omeprazole (PRILOSEC) 20 MG capsule Take 1 capsule (20 mg total) by mouth every morning.   OVER THE COUNTER MEDICATION 20 mg daily. Collagen 2 scoops   timolol (TIMOPTIC) 0.5 % ophthalmic solution Place 1 drop into both eyes daily.    [DISCONTINUED] Latanoprost-Timolol Maleate 0.005-0.5 % SOLN latanoprost   [DISCONTINUED] amLODipine (NORVASC) 5 MG tablet  Take 1 tablet (5 mg total) by mouth daily. (Patient not taking: Reported on 05/07/2022)   [DISCONTINUED] naproxen (NAPROSYN) 375 MG tablet Take 1 tablet (375 mg total) by mouth 2 (two) times daily with a meal. (Patient not taking: Reported on 10/23/2021)   [DISCONTINUED] predniSONE (DELTASONE) 20 MG tablet Take 1 tablet (20 mg total) by mouth daily with breakfast. (Patient not taking: Reported on 10/23/2021)   No facility-administered medications prior to visit.    No Known Allergies  Patient Care Team: Maple HudsonGilbert, Ymani Porcher L Jr., MD as PCP - General (Family Medicine) Lockie MolaBrasington, Chadwick, MD as Referring Physician (Ophthalmology) Lamar BlinksKowalski, Bruce J, MD as Consulting Physician (Cardiology)  Review of Systems  All other systems reviewed and are negative.      Objective    Vitals: BP 130/72 (BP Location: Right Arm, Patient Position: Sitting, Cuff Size: Normal)   Pulse 70   Temp 97.8 F (36.6 C) (Temporal)   Resp 16   Ht 5\' 3"  (1.6 m)   Wt 113 lb 6.4 oz (51.4 kg)   SpO2 97%   BMI 20.09 kg/m     Physical Exam Vitals reviewed.  Constitutional:      Appearance: Normal appearance.  HENT:     Head: Normocephalic and atraumatic.  Eyes:     Extraocular Movements: Extraocular movements intact.     Conjunctiva/sclera: Conjunctivae normal.     Pupils: Pupils are equal, round, and reactive to light.  Neck:     Vascular: No carotid bruit.  Cardiovascular:     Rate and Rhythm: Normal rate and regular rhythm.     Pulses: Normal pulses.     Heart sounds: Normal heart sounds.  Pulmonary:     Effort: Pulmonary effort is normal.     Breath sounds: Normal breath sounds.  Abdominal:     General: Bowel sounds are normal.     Palpations: Abdomen is soft.     Tenderness: There is no abdominal tenderness.  Musculoskeletal:     Cervical back: No tenderness.     Right lower leg: No edema.     Left lower leg: No edema.  Lymphadenopathy:     Cervical: No cervical adenopathy.  Skin:     General: Skin is warm and dry.  Neurological:     Mental Status: She is alert and oriented to person, place, and time. Mental status is at baseline.  Psychiatric:        Mood and Affect: Mood normal.        Behavior: Behavior normal.        Thought Content: Thought content normal.        Judgment: Judgment normal.     Most recent functional status assessment:    10/23/2021    2:04 PM  In your present state of health, do you have any difficulty performing the following activities:  Hearing? 1  Vision? 0  Walking or climbing stairs? 1  Dressing or bathing? 0  Doing errands, shopping? 0   Most recent fall risk assessment:    10/23/2021    2:03 PM  Fall Risk   Falls in the past year? 0  Number falls in past yr: 0  Injury with Fall? 0  Risk for fall due to : No Fall Risks  Follow up Falls evaluation completed    Most recent depression screenings:    10/23/2021    2:03 PM 10/23/2020    1:54 PM  PHQ 2/9 Scores  PHQ - 2 Score 0 0  PHQ- 9 Score 4 1   Most recent cognitive screening:     View : No data to display.         Most recent Audit-C alcohol use screening    10/23/2021    2:04 PM  Alcohol Use Disorder Test (AUDIT)  1. How often do you have a drink containing alcohol? 4  2. How many drinks containing alcohol do you have on a typical day when you are drinking? 0  3. How often do you have six or more drinks on one occasion? 0  AUDIT-C Score 4   A score of 3 or more in women, and 4 or more in men indicates increased risk for alcohol abuse, EXCEPT if all of the points are from question 1   No results found for any visits on 05/07/22.  Assessment & Plan     Annual wellness visit done today including the all of the following: Reviewed patient's Family Medical History Reviewed and updated list of patient's medical providers Assessment of cognitive impairment was done Assessed patient's functional ability Established a written schedule for health screening  services Health Risk Assessent Completed and Reviewed  Exercise Activities and Dietary recommendations  Goals      DIET - INCREASE WATER INTAKE     Recommend increasing of water intake to 4 glasses a day.       Prevent falls     Recommend to remove any items  from the home that may cause slips or trips.        Immunization History  Administered Date(s) Administered   Fluad Quad(high Dose 65+) 10/10/2020   Influenza, High Dose Seasonal PF 10/17/2019   Influenza-Unspecified 09/13/2013, 10/14/2018   PFIZER(Purple Top)SARS-COV-2 Vaccination 12/28/2019, 01/18/2020, 09/10/2020   Pfizer Covid-19 Vaccine Bivalent Booster 48yrs & up 04/13/2022   Pneumococcal Conjugate-13 10/15/2014   Pneumococcal Polysaccharide-23 09/14/2007, 10/17/2019   Td 07/30/2003   Tdap 11/12/2014    Health Maintenance  Topic Date Due   Zoster Vaccines- Shingrix (1 of 2) Never done   DEXA SCAN  01/07/2022   INFLUENZA VACCINE  07/14/2022   TETANUS/TDAP  11/12/2024   Pneumonia Vaccine 28+ Years old  Completed   COVID-19 Vaccine  Completed   HPV VACCINES  Aged Out     Discussed health benefits of physical activity, and encouraged her to engage in regular exercise appropriate for her age and condition.    1. Encounter for Medicare annual wellness exam She is still lives independently. He has 3 sons and 3 grandchildren.  2. Benign essential HTN Controlled  3. Adult hypothyroidism Treat for euthyroid TSH  4. Chronic atrial fibrillation (HCC)   5. Allergic rhinitis, unspecified seasonality, unspecified trigger   6. Lumbar radiculopathy Improved after injection  7. Sciatica of right side Proved  8. Artificial cardiac pacemaker/SSS     Return in about 6 months (around 11/07/2022).     I, Megan Mans, MD, have reviewed all documentation for this visit. The documentation on 05/08/22 for the exam, diagnosis, procedures, and orders are all accurate and complete.    Gurjit Loconte Wendelyn Breslow, MD  [facility] [phone] (phone) [phone] (fax)  [facility]

## 2022-06-02 DIAGNOSIS — I495 Sick sinus syndrome: Secondary | ICD-10-CM | POA: Diagnosis not present

## 2022-06-17 DIAGNOSIS — I482 Chronic atrial fibrillation, unspecified: Secondary | ICD-10-CM | POA: Diagnosis not present

## 2022-06-17 DIAGNOSIS — Z950 Presence of cardiac pacemaker: Secondary | ICD-10-CM | POA: Diagnosis not present

## 2022-06-17 DIAGNOSIS — I38 Endocarditis, valve unspecified: Secondary | ICD-10-CM | POA: Diagnosis not present

## 2022-06-17 DIAGNOSIS — I495 Sick sinus syndrome: Secondary | ICD-10-CM | POA: Diagnosis not present

## 2022-06-17 DIAGNOSIS — I10 Essential (primary) hypertension: Secondary | ICD-10-CM | POA: Diagnosis not present

## 2022-06-29 ENCOUNTER — Other Ambulatory Visit: Payer: Self-pay | Admitting: Family Medicine

## 2022-08-18 DIAGNOSIS — H353131 Nonexudative age-related macular degeneration, bilateral, early dry stage: Secondary | ICD-10-CM | POA: Diagnosis not present

## 2022-09-24 ENCOUNTER — Ambulatory Visit (LOCAL_COMMUNITY_HEALTH_CENTER): Payer: Medicare Other

## 2022-09-24 DIAGNOSIS — Z23 Encounter for immunization: Secondary | ICD-10-CM | POA: Diagnosis not present

## 2022-09-24 DIAGNOSIS — Z7185 Encounter for immunization safety counseling: Secondary | ICD-10-CM

## 2022-09-24 NOTE — Progress Notes (Signed)
[place] 12 yrs and up 2023-24 vaccine given today without problem. Declines to stay for 15 min observation. Updated [facility] copy and COVID card given.   Are you feeling sick today? No   Have you ever received a dose of COVID-19 Vaccine? [employer], [place], [place], [place], Other) Yes  If yes, which vaccine and how many doses?  Pfizer and 6 doses    Did you bring the vaccination record card or other documentation?  No   Do you have a health condition or are undergoing treatment that makes you moderately or severely immunocompromised? This would include, but not be limited to: cancer, HIV, organ transplant, immunosuppressive therapy/high-dose corticosteroids, or moderate/severe primary immunodeficiency.  No  Have you received COVID-19 vaccine before or during hematopoietic cell transplant (HCT) or CAR-T-cell therapies? No  Have you ever had an allergic reaction to: (This would include a severe allergic reaction or a reaction that caused hives, swelling, or respiratory distress, including wheezing.) A component of a COVID-19 vaccine or a previous dose of COVID-19 vaccine? No   Have you ever had an allergic reaction to another vaccine (other thanCOVID-19 vaccine) or an injectable medication? (This would include a severe allergic reaction or a reaction that caused hives, swelling, or respiratory distress, including wheezing.)   No    Do you have a history of any of the following:  Myocarditis or Pericarditis No  Dermal fillers:  No  Multisystem Inflammatory Syndrome (MIS-C or MIS-A)? No  COVID-19 disease within the past 3 months? No  Vaccinated with monkeypox vaccine in the last 4 weeks? No  Josie Saunders, RN

## 2022-10-14 DIAGNOSIS — I495 Sick sinus syndrome: Secondary | ICD-10-CM | POA: Diagnosis not present

## 2022-10-20 DIAGNOSIS — Z23 Encounter for immunization: Secondary | ICD-10-CM | POA: Diagnosis not present

## 2022-11-12 ENCOUNTER — Ambulatory Visit: Payer: Medicare Other | Admitting: Family Medicine

## 2022-11-24 NOTE — Progress Notes (Unsigned)
I,Odus Clasby S Mckinzey Entwistle,acting as a Education administrator for Lavon Paganini, MD.,have documented all relevant documentation on the behalf of Lavon Paganini, MD,as directed by  Lavon Paganini, MD while in the presence of Lavon Paganini, MD.     Established patient visit   Patient: Savannah Tucker   DOB: 03-10-1930   88 y.o. Female  MRN: [id number] Visit Date: 11/26/2022  Today's healthcare provider: Lavon Paganini, MD   No chief complaint on file.  Subjective    HPI  Hypertension, follow-up  BP Readings from Last 3 Encounters:  05/07/22 130/72  10/23/21 (!) 175/98  07/30/21 (!) 165/92   Wt Readings from Last 3 Encounters:  05/07/22 113 lb 6.4 oz (51.4 kg)  10/23/21 117 lb (53.1 kg)  07/30/21 123 lb (55.8 kg)     She was last seen for hypertension 6 months ago.  BP at that visit was 130/72. Management since that visit includes no changes. She reports {excellent/good/fair/poor:19665} compliance with treatment.  Use of agents associated with hypertension: {bp agents assoc with hypertension:511::"none"}.   --------------------------------------------------------------------------------------------------- Lipid/Cholesterol, follow-up  Last Lipid Panel: Lab Results  Component Value Date   CHOL 215 (H) 05/03/2018   LDLCALC 103 (H) 05/03/2018   HDL 96 05/03/2018   TRIG 78 05/03/2018    She was last seen for this 6 months ago.  Management since that visit includes no changes.  She reports {excellent/good/fair/poor:19665} compliance with treatment. She {is/is not:9024} having side effects. {document side effects if RPZPSUG:6}  Last metabolic panel Lab Results  Component Value Date   GLUCOSE 102 (H) 10/23/2021   NA 132 (L) 10/23/2021   K 4.3 10/23/2021   BUN 18 10/23/2021   CREATININE 0.80 10/23/2021   EGFR 70 10/23/2021   GFRNONAA 66 06/20/2020   CALCIUM 9.9 10/23/2021   AST 19 10/23/2021   ALT 16 10/23/2021   The ASCVD Risk score (Arnett DK, et al., 2019)  failed to calculate for the following reasons:   The 2019 ASCVD risk score is only valid for ages 72 to 46  --------------------------------------------------------------------------------------------------- Hypothyroid, follow-up  Lab Results  Component Value Date   TSH 1.660 10/23/2021   TSH 2.230 04/22/2021   TSH 2.010 06/20/2020    Wt Readings from Last 3 Encounters:  05/07/22 113 lb 6.4 oz (51.4 kg)  10/23/21 117 lb (53.1 kg)  07/30/21 123 lb (55.8 kg)    She was last seen for hypothyroid 6 months ago.  Management since that visit includes no changes. She reports {excellent/good/fair/poor:19665} compliance with treatment. She {is/is not:21021397} having side effects. {document side effects if present:1}  -----------------------------------------------------------------------------------------   Medications: Outpatient Medications Prior to Visit  Medication Sig   acetaminophen (TYLENOL) 500 MG tablet Take 1,000 mg by mouth every 6 (six) hours as needed for moderate pain.   aspirin 325 MG tablet Take 325 mg by mouth.    calcium carbonate (OS-CAL) 600 MG TABS tablet Take 1,200 mg by mouth daily.    cetirizine (ZYRTEC) 10 MG tablet TAKE 1 TABLET BY MOUTH DAILY.   levothyroxine (SYNTHROID) 50 MCG tablet TAKE 1 TABLET EVERY DAY ON EMPTY STOMACHWITH A GLASS OF WATER AT LEAST 30-60 MINBEFORE BREAKFAST   omeprazole (PRILOSEC) 20 MG capsule Take 1 capsule (20 mg total) by mouth every morning.   OVER THE COUNTER MEDICATION 20 mg daily. Collagen 2 scoops   timolol (TIMOPTIC) 0.5 % ophthalmic solution Place 1 drop into both eyes daily.    No facility-administered medications prior to visit.    Review of Systems  Constitutional:  Negative for appetite change, chills, diaphoresis, fatigue and unexpected weight change.  Eyes:  Negative for visual disturbance.  Respiratory:  Negative for chest tightness and shortness of breath.   Cardiovascular:  Negative for chest pain,  palpitations and leg swelling.  Gastrointestinal:  Negative for abdominal pain, constipation, diarrhea, nausea and vomiting.  Endocrine: Negative for cold intolerance and heat intolerance.  Neurological:  Negative for dizziness, light-headedness and headaches.    {Labs  Heme  Chem  Endocrine  Serology  Results Review (optional):23779}   Objective    There were no vitals taken for this visit. {Show previous vital signs (optional):23777}  Physical Exam  ***  No results found for any visits on 11/26/22.  Assessment & Plan     ***  No follow-ups on file.      {provider attestation***:1}   Lavon Paganini, MD  [facility] [phone] (phone) [phone] (fax)  [place]

## 2022-11-26 ENCOUNTER — Other Ambulatory Visit: Payer: Self-pay | Admitting: Family Medicine

## 2022-11-26 ENCOUNTER — Encounter: Payer: Self-pay | Admitting: Family Medicine

## 2022-11-26 ENCOUNTER — Ambulatory Visit (INDEPENDENT_AMBULATORY_CARE_PROVIDER_SITE_OTHER): Payer: Medicare Other | Admitting: Family Medicine

## 2022-11-26 VITALS — BP 150/76 | HR 87 | Temp 97.6°F | Resp 16 | Wt 112.1 lb

## 2022-11-26 DIAGNOSIS — I10 Essential (primary) hypertension: Secondary | ICD-10-CM

## 2022-11-26 DIAGNOSIS — E7800 Pure hypercholesterolemia, unspecified: Secondary | ICD-10-CM

## 2022-11-26 DIAGNOSIS — E039 Hypothyroidism, unspecified: Secondary | ICD-10-CM

## 2022-11-26 NOTE — Assessment & Plan Note (Signed)
Elevated today She blames rushing around to get here on time Will not start a medicaiton at this time - she has had orthostatic hypotension in the past from an antiHTN Will watch home BPs and recheck in 2 months

## 2022-11-26 NOTE — Assessment & Plan Note (Signed)
Previously well controlled Continue Synthroid at current dose  Recheck TSH and adjust Synthroid as indicated   

## 2022-11-26 NOTE — Assessment & Plan Note (Signed)
Reviewed last lipid panel Not currently on a statin Recheck FLP and CMP - may not check again Would avoid statin for primary prevention at age 88 Discussed diet and exercise

## 2022-11-27 LAB — COMPREHENSIVE METABOLIC PANEL
ALT: 14 IU/L (ref 0–32)
AST: 19 IU/L (ref 0–40)
Albumin/Globulin Ratio: 1.5 (ref 1.2–2.2)
Albumin: 4.3 g/dL (ref 3.6–4.6)
Alkaline Phosphatase: 88 IU/L (ref 44–121)
BUN/Creatinine Ratio: 22 (ref 12–28)
BUN: 20 mg/dL (ref 10–36)
Bilirubin Total: 0.5 mg/dL (ref 0.0–1.2)
CO2: 27 mmol/L (ref 20–29)
Calcium: 10.2 mg/dL (ref 8.7–10.3)
Chloride: 99 mmol/L (ref 96–106)
Creatinine, Ser: 0.92 mg/dL (ref 0.57–1.00)
Globulin, Total: 2.9 g/dL (ref 1.5–4.5)
Glucose: 96 mg/dL (ref 70–99)
Potassium: 4.5 mmol/L (ref 3.5–5.2)
Sodium: 138 mmol/L (ref 134–144)
Total Protein: 7.2 g/dL (ref 6.0–8.5)
eGFR: 59 mL/min/{1.73_m2} — ABNORMAL LOW (ref >59)

## 2022-11-27 LAB — LIPID PANEL WITH LDL/HDL RATIO
Cholesterol, Total: 196 mg/dL (ref 100–199)
HDL: 82 mg/dL (ref >39)
LDL Chol Calc (NIH): 97 mg/dL (ref 0–99)
LDL/HDL Ratio: 1.2 ratio (ref 0.0–3.2)
Triglycerides: 94 mg/dL (ref 0–149)
VLDL Cholesterol Cal: 17 mg/dL (ref 5–40)

## 2022-11-27 LAB — TSH: TSH: 2.15 u[IU]/mL (ref 0.450–4.500)

## 2023-01-19 DIAGNOSIS — I482 Chronic atrial fibrillation, unspecified: Secondary | ICD-10-CM | POA: Diagnosis not present

## 2023-01-19 DIAGNOSIS — I495 Sick sinus syndrome: Secondary | ICD-10-CM | POA: Diagnosis not present

## 2023-02-04 ENCOUNTER — Ambulatory Visit (INDEPENDENT_AMBULATORY_CARE_PROVIDER_SITE_OTHER): Payer: Medicare Other | Admitting: Family Medicine

## 2023-02-04 ENCOUNTER — Encounter: Payer: Self-pay | Admitting: Family Medicine

## 2023-02-04 VITALS — BP 120/70 | HR 81 | Temp 98.7°F | Resp 12 | Ht 63.0 in | Wt 113.0 lb

## 2023-02-04 DIAGNOSIS — I10 Essential (primary) hypertension: Secondary | ICD-10-CM

## 2023-02-04 NOTE — Progress Notes (Signed)
I,Sulibeya S Dimas,acting as a Education administrator for Lavon Paganini, MD.,have documented all relevant documentation on the behalf of Lavon Paganini, MD,as directed by  Lavon Paganini, MD while in the presence of Lavon Paganini, MD.     Established patient visit   Patient: Savannah Tucker   DOB: 09/28/1930   89 y.o. Female  MRN: [id number] Visit Date: 02/04/2023  Today's healthcare provider: Lavon Paganini, MD   Chief Complaint  Patient presents with   Hypertension   Subjective    HPI  Hypertension, follow-up  BP Readings from Last 3 Encounters:  02/04/23 120/70  11/26/22 (!) 150/76  05/07/22 130/72   Wt Readings from Last 3 Encounters:  02/04/23 113 lb (51.3 kg)  11/26/22 112 lb 1.6 oz (50.8 kg)  05/07/22 113 lb 6.4 oz (51.4 kg)     She was last seen for hypertension 2 months ago.  BP at that visit was 150/76 . Management since that visit includes no changes. Patient refused to start medication.  Outside blood pressures are 120/70, 150/100, 140/90, 130/84, 124/70, 120/70.  Pertinent labs Lab Results  Component Value Date   CHOL 196 11/26/2022   HDL 82 11/26/2022   LDLCALC 97 11/26/2022   TRIG 94 11/26/2022   Lab Results  Component Value Date   NA 138 11/26/2022   K 4.5 11/26/2022   CREATININE 0.92 11/26/2022   EGFR 59 (L) 11/26/2022   GLUCOSE 96 11/26/2022   TSH 2.150 11/26/2022     The ASCVD Risk score (Arnett DK, et al., 2019) failed to calculate for the following reasons:   The 2019 ASCVD risk score is only valid for ages 51 to 28 ---------------------------------------------------------------------------------------------------   Medications: Outpatient Medications Prior to Visit  Medication Sig   acetaminophen (TYLENOL) 500 MG tablet Take 1,000 mg by mouth every 6 (six) hours as needed for moderate pain.   aspirin 325 MG tablet Take 325 mg by mouth.    calcium carbonate (OS-CAL) 600 MG TABS tablet Take 1,200 mg by mouth daily.    cetirizine  (ZYRTEC) 10 MG tablet TAKE 1 TABLET BY MOUTH DAILY.   levothyroxine (SYNTHROID) 50 MCG tablet TAKE 1 TABLET EVERY DAY ON EMPTY STOMACHWITH A GLASS OF WATER AT LEAST 30-60 MINBEFORE BREAKFAST   omeprazole (PRILOSEC) 20 MG capsule Take 1 capsule (20 mg total) by mouth every morning.   OVER THE COUNTER MEDICATION 20 mg daily. Collagen 2 scoops   timolol (TIMOPTIC) 0.5 % ophthalmic solution Place 1 drop into both eyes daily.    No facility-administered medications prior to visit.    Review of Systems  Constitutional:  Negative for fatigue.  Eyes:  Negative for visual disturbance.  Respiratory:  Negative for chest tightness and shortness of breath.   Cardiovascular:  Negative for chest pain and leg swelling.  Gastrointestinal:  Negative for nausea and vomiting.  Neurological:  Negative for dizziness, light-headedness and headaches.       Objective    BP 120/70 Comment: home reading  Pulse 81   Temp 98.7 F (37.1 C) (Temporal)   Resp 12   Ht 5' 3"$  (1.6 m)   Wt 113 lb (51.3 kg)   SpO2 95%   BMI 20.02 kg/m    Physical Exam Vitals reviewed.  Constitutional:      General: She is not in acute distress.    Appearance: Normal appearance. She is well-developed. She is not diaphoretic.  HENT:     Head: Normocephalic and atraumatic.  Eyes:     General:  No scleral icterus.    Conjunctiva/sclera: Conjunctivae normal.  Neck:     Thyroid: No thyromegaly.  Cardiovascular:     Rate and Rhythm: Normal rate and regular rhythm.     Heart sounds: Normal heart sounds. No murmur heard. Pulmonary:     Effort: Pulmonary effort is normal. No respiratory distress.     Breath sounds: Normal breath sounds. No wheezing, rhonchi or rales.  Musculoskeletal:     Cervical back: Neck supple.     Right lower leg: No edema.     Left lower leg: No edema.  Lymphadenopathy:     Cervical: No cervical adenopathy.  Skin:    General: Skin is warm and dry.  Neurological:     Mental Status: She is alert.  Mental status is at baseline.       No results found for any visits on 02/04/23.  Assessment & Plan     Problem List Items Addressed This Visit       Cardiovascular and Mediastinum   Benign essential HTN - Primary    Well-controlled on home readings Continue lifestyle management Reviewed last labs Patient does have a history of orthostatic hypotension in the past from an antihypertensive and given her advanced age, we will try to avoid medications if possible and she has a higher blood pressure goal        Return in about 3 months (around 05/05/2023) for AWV.      I, Lavon Paganini, MD, have reviewed all documentation for this visit. The documentation on 02/04/23 for the exam, diagnosis, procedures, and orders are all accurate and complete.   Mansur Patti, Dionne Bucy, MD, MPH [facility]

## 2023-02-04 NOTE — Assessment & Plan Note (Signed)
Well-controlled on home readings Continue lifestyle management Reviewed last labs Patient does have a history of orthostatic hypotension in the past from an antihypertensive and given her advanced age, we will try to avoid medications if possible and she has a higher blood pressure goal

## 2023-02-12 DIAGNOSIS — H401131 Primary open-angle glaucoma, bilateral, mild stage: Secondary | ICD-10-CM | POA: Diagnosis not present

## 2023-02-19 DIAGNOSIS — Z961 Presence of intraocular lens: Secondary | ICD-10-CM | POA: Diagnosis not present

## 2023-02-19 DIAGNOSIS — H401131 Primary open-angle glaucoma, bilateral, mild stage: Secondary | ICD-10-CM | POA: Diagnosis not present

## 2023-02-19 DIAGNOSIS — H353131 Nonexudative age-related macular degeneration, bilateral, early dry stage: Secondary | ICD-10-CM | POA: Diagnosis not present

## 2023-04-26 DIAGNOSIS — I38 Endocarditis, valve unspecified: Secondary | ICD-10-CM | POA: Diagnosis not present

## 2023-04-26 DIAGNOSIS — I495 Sick sinus syndrome: Secondary | ICD-10-CM | POA: Diagnosis not present

## 2023-04-26 DIAGNOSIS — Z950 Presence of cardiac pacemaker: Secondary | ICD-10-CM | POA: Diagnosis not present

## 2023-04-26 DIAGNOSIS — I10 Essential (primary) hypertension: Secondary | ICD-10-CM | POA: Diagnosis not present

## 2023-04-26 DIAGNOSIS — I340 Nonrheumatic mitral (valve) insufficiency: Secondary | ICD-10-CM | POA: Diagnosis not present

## 2023-04-26 DIAGNOSIS — I482 Chronic atrial fibrillation, unspecified: Secondary | ICD-10-CM | POA: Diagnosis not present

## 2023-05-06 ENCOUNTER — Encounter: Payer: Self-pay | Admitting: Family Medicine

## 2023-05-06 ENCOUNTER — Ambulatory Visit (INDEPENDENT_AMBULATORY_CARE_PROVIDER_SITE_OTHER): Payer: Medicare Other | Admitting: Family Medicine

## 2023-05-06 VITALS — BP 120/70 | HR 67 | Temp 98.2°F | Resp 12 | Ht 63.0 in | Wt 113.1 lb

## 2023-05-06 DIAGNOSIS — E785 Hyperlipidemia, unspecified: Secondary | ICD-10-CM

## 2023-05-06 DIAGNOSIS — I10 Essential (primary) hypertension: Secondary | ICD-10-CM

## 2023-05-06 DIAGNOSIS — E039 Hypothyroidism, unspecified: Secondary | ICD-10-CM

## 2023-05-06 DIAGNOSIS — Z0000 Encounter for general adult medical examination without abnormal findings: Secondary | ICD-10-CM | POA: Diagnosis not present

## 2023-05-06 NOTE — Assessment & Plan Note (Signed)
Previously well controlled Continue Synthroid at current dose  Recheck TSH and adjust Synthroid as indicated   

## 2023-05-06 NOTE — Assessment & Plan Note (Signed)
Well-controlled on home readings Continue lifestyle management Recheck metabolic panel Patient does have a history of orthostatic hypotension in the past from an antihypertensive and given her advanced age, we will try to avoid medications if possible and she has a higher blood pressure goal

## 2023-05-06 NOTE — Assessment & Plan Note (Addendum)
Reviewed last lipid panel Not currently on a statin Reviewed FLP and CMP - may not check again Would avoid statin for primary prevention at age 89 Discussed diet and exercise

## 2023-05-06 NOTE — Progress Notes (Signed)
I,Sulibeya S Dimas,acting as a Neurosurgeon for Shirlee Latch, MD.,have documented all relevant documentation on the behalf of Shirlee Latch, MD,as directed by  Shirlee Latch, MD while in the presence of Shirlee Latch, MD.   Annual Wellness Visit     Patient: Savannah Tucker, Female    DOB: Apr 19, 1930, 89 y.o.   MRN: [id number] Visit Date: 05/06/2023  Today's Provider: Shirlee Latch, MD   Chief Complaint  Patient presents with   Annual Exam   Subjective    Savannah Tucker is a 89 y.o. female who presents today for her Annual Wellness Visit. She reports consuming a general diet. The patient does not participate in regular exercise at present. She generally feels well. She reports sleeping well. She does not have additional problems to discuss today.   HPI Declines Shingrix vaccine  Medications: Outpatient Medications Prior to Visit  Medication Sig   acetaminophen (TYLENOL) 500 MG tablet Take 1,000 mg by mouth every 6 (six) hours as needed for moderate pain.   aspirin 325 MG tablet Take 325 mg by mouth.    calcium carbonate (OS-CAL) 600 MG TABS tablet Take 1,200 mg by mouth daily.    cetirizine (ZYRTEC) 10 MG tablet TAKE 1 TABLET BY MOUTH DAILY.   levothyroxine (SYNTHROID) 50 MCG tablet TAKE 1 TABLET EVERY DAY ON EMPTY STOMACHWITH A GLASS OF WATER AT LEAST 30-60 MINBEFORE BREAKFAST   omeprazole (PRILOSEC) 20 MG capsule Take 1 capsule (20 mg total) by mouth every morning.   OVER THE COUNTER MEDICATION 20 mg daily. Collagen 2 scoops   timolol (TIMOPTIC) 0.5 % ophthalmic solution Place 1 drop into both eyes daily.    No facility-administered medications prior to visit.    No Known Allergies  Patient Care Team: Erasmo Downer, MD as PCP - General (Family Medicine) Lockie Mola, MD as Referring Physician (Ophthalmology) Lamar Blinks, MD as Consulting Physician (Cardiology)  Review of Systems  All other systems reviewed and are  negative.   Last CBC Lab Results  Component Value Date   WBC 6.6 10/23/2021   HGB 13.5 10/23/2021   HCT 40.4 10/23/2021   MCV 98 (H) 10/23/2021   MCH 32.7 10/23/2021   RDW 12.6 10/23/2021   PLT 361 10/23/2021   Last metabolic panel Lab Results  Component Value Date   GLUCOSE 96 11/26/2022   NA 138 11/26/2022   K 4.5 11/26/2022   CL 99 11/26/2022   CO2 27 11/26/2022   BUN 20 11/26/2022   CREATININE 0.92 11/26/2022   EGFR 59 (L) 11/26/2022   CALCIUM 10.2 11/26/2022   PROT 7.2 11/26/2022   ALBUMIN 4.3 11/26/2022   LABGLOB 2.9 11/26/2022   AGRATIO 1.5 11/26/2022   BILITOT 0.5 11/26/2022   ALKPHOS 88 11/26/2022   AST 19 11/26/2022   ALT 14 11/26/2022   ANIONGAP 4 (L) 11/20/2013   Last lipids Lab Results  Component Value Date   CHOL 196 11/26/2022   HDL 82 11/26/2022   LDLCALC 97 11/26/2022   TRIG 94 11/26/2022   Last hemoglobin A1c No results found for: "HGBA1C" Last thyroid functions Lab Results  Component Value Date   TSH 2.150 11/26/2022   Last vitamin D No results found for: "25OHVITD2", "25OHVITD3", "VD25OH" Last vitamin B12 and Folate No results found for: "VITAMINB12", "FOLATE"      Objective    Vitals: BP 120/70 Comment: home reading  Pulse 67   Temp 98.2 F (36.8 C) (Temporal)   Resp 12   Ht 5'  3" (1.6 m)   Wt 113 lb 1.6 oz (51.3 kg)   SpO2 98%   BMI 20.03 kg/m  BP Readings from Last 3 Encounters:  05/06/23 120/70  02/04/23 120/70  11/26/22 (!) 150/76   Wt Readings from Last 3 Encounters:  05/06/23 113 lb 1.6 oz (51.3 kg)  02/04/23 113 lb (51.3 kg)  11/26/22 112 lb 1.6 oz (50.8 kg)      Physical Exam Vitals reviewed.  Constitutional:      General: She is not in acute distress.    Appearance: Normal appearance. She is well-developed. She is not diaphoretic.  HENT:     Head: Normocephalic and atraumatic.     Right Ear: Tympanic membrane, ear canal and external ear normal.     Left Ear: Tympanic membrane, ear canal and external  ear normal.     Nose: Nose normal.     Mouth/Throat:     Mouth: Mucous membranes are moist.     Pharynx: Oropharynx is clear. No oropharyngeal exudate.  Eyes:     General: No scleral icterus.    Conjunctiva/sclera: Conjunctivae normal.     Pupils: Pupils are equal, round, and reactive to light.  Neck:     Thyroid: No thyromegaly.  Cardiovascular:     Rate and Rhythm: Normal rate and regular rhythm.     Heart sounds: Normal heart sounds. No murmur heard. Pulmonary:     Effort: Pulmonary effort is normal. No respiratory distress.     Breath sounds: Normal breath sounds. No wheezing or rales.  Abdominal:     General: There is no distension.     Palpations: Abdomen is soft.     Tenderness: There is no abdominal tenderness.  Musculoskeletal:        General: No deformity.     Cervical back: Neck supple.     Right lower leg: No edema.     Left lower leg: No edema.  Lymphadenopathy:     Cervical: No cervical adenopathy.  Skin:    General: Skin is warm and dry.     Findings: No rash.  Neurological:     Mental Status: She is alert and oriented to person, place, and time. Mental status is at baseline.     Gait: Gait normal.  Psychiatric:        Mood and Affect: Mood normal.        Behavior: Behavior normal.        Thought Content: Thought content normal.      Most recent functional status assessment:    05/06/2023    1:34 PM  In your present state of health, do you have any difficulty performing the following activities:  Hearing? 1  Vision? 0  Difficulty concentrating or making decisions? 1  Walking or climbing stairs? 1  Dressing or bathing? 0  Doing errands, shopping? 0   Most recent fall risk assessment:    05/06/2023    1:34 PM  Fall Risk   Falls in the past year? 0  Number falls in past yr: 0  Injury with Fall? 0  Risk for fall due to : No Fall Risks  Follow up Falls evaluation completed    Most recent depression screenings:    05/06/2023    1:34 PM  02/04/2023    2:53 PM  PHQ 2/9 Scores  PHQ - 2 Score 1 3  PHQ- 9 Score  6   Most recent cognitive screening:     No data to display  Most recent Audit-C alcohol use screening    05/06/2023    1:35 PM  Alcohol Use Disorder Test (AUDIT)  1. How often do you have a drink containing alcohol? 4  2. How many drinks containing alcohol do you have on a typical day when you are drinking? 0  3. How often do you have six or more drinks on one occasion? 0  AUDIT-C Score 4   A score of 3 or more in women, and 4 or more in men indicates increased risk for alcohol abuse, EXCEPT if all of the points are from question 1   No results found for any visits on 05/06/23.  Assessment & Plan     Annual wellness visit done today including the all of the following: Reviewed patient's Family Medical History Reviewed and updated list of patient's medical providers Assessment of cognitive impairment was done Assessed patient's functional ability Established a written schedule for health screening services Health Risk Assessent Completed and Reviewed  Exercise Activities and Dietary recommendations  Goals      DIET - INCREASE WATER INTAKE     Recommend increasing of water intake to 4 glasses a day.      Prevent falls     Recommend to remove any items from the home that may cause slips or trips.        Immunization History  Administered Date(s) Administered   COVID-19, mRNA, vaccine(Comirnaty)12 years and older 09/24/2022   Fluad Quad(high Dose 65+) 10/10/2020, 10/20/2022   Influenza, High Dose Seasonal PF 10/17/2019   Influenza-Unspecified 09/13/2013, 10/14/2018   PFIZER(Purple Top)SARS-COV-2 Vaccination 12/28/2019, 01/18/2020, 09/10/2020   Pfizer Covid-19 Vaccine Bivalent Booster 23yrs & up 04/13/2022   Pneumococcal Conjugate-13 10/15/2014   Pneumococcal Polysaccharide-23 09/14/2007, 10/17/2019   Td 07/30/2003   Tdap 11/12/2014    Health Maintenance  Topic Date Due   Zoster  Vaccines- Shingrix (1 of 2) Never done   DEXA SCAN  01/07/2022   INFLUENZA VACCINE  07/15/2023   Medicare Annual Wellness (AWV)  05/05/2024   DTaP/Tdap/Td (3 - Td or Tdap) 11/12/2024   Pneumonia Vaccine 68+ Years old  Completed   COVID-19 Vaccine  Completed   HPV VACCINES  Aged Out     Discussed health benefits of physical activity, and encouraged her to engage in regular exercise appropriate for her age and condition.    Problem List Items Addressed This Visit       Cardiovascular and Mediastinum   Benign essential HTN    Well-controlled on home readings Continue lifestyle management Recheck metabolic panel Patient does have a history of orthostatic hypotension in the past from an antihypertensive and given her advanced age, we will try to avoid medications if possible and she has a higher blood pressure goal      Relevant Orders   Basic Metabolic Panel (BMET)     Endocrine   Adult hypothyroidism    Previously well controlled Continue Synthroid at current dose  Recheck TSH and adjust Synthroid as indicated        Relevant Orders   TSH     Other   HLD (hyperlipidemia)    Reviewed last lipid panel Not currently on a statin Reviewed FLP and CMP - may not check again Would avoid statin for primary prevention at age 46 Discussed diet and exercise        Other Visit Diagnoses     Encounter for annual wellness visit (AWV) in Medicare patient    -  Primary  Return in about 1 year (around 05/05/2024) for AWV.     I, Shirlee Latch, MD, have reviewed all documentation for this visit. The documentation on 05/06/23 for the exam, diagnosis, procedures, and orders are all accurate and complete.   Maan Zarcone, Marzella Schlein, MD, MPH [facility]

## 2023-05-07 LAB — TSH: TSH: 1.87 u[IU]/mL (ref 0.450–4.500)

## 2023-05-07 LAB — BASIC METABOLIC PANEL
BUN/Creatinine Ratio: 20 (ref 12–28)
BUN: 17 mg/dL (ref 10–36)
CO2: 22 mmol/L (ref 20–29)
Calcium: 9.9 mg/dL (ref 8.7–10.3)
Chloride: 98 mmol/L (ref 96–106)
Creatinine, Ser: 0.84 mg/dL (ref 0.57–1.00)
Glucose: 94 mg/dL (ref 70–99)
Potassium: 5 mmol/L (ref 3.5–5.2)
Sodium: 137 mmol/L (ref 134–144)
eGFR: 66 mL/min/{1.73_m2} (ref >59)

## 2023-05-12 DIAGNOSIS — I38 Endocarditis, valve unspecified: Secondary | ICD-10-CM | POA: Diagnosis not present

## 2023-05-12 DIAGNOSIS — I482 Chronic atrial fibrillation, unspecified: Secondary | ICD-10-CM | POA: Diagnosis not present

## 2023-05-12 DIAGNOSIS — I340 Nonrheumatic mitral (valve) insufficiency: Secondary | ICD-10-CM | POA: Diagnosis not present

## 2023-06-22 DIAGNOSIS — I495 Sick sinus syndrome: Secondary | ICD-10-CM | POA: Diagnosis not present

## 2023-06-27 DIAGNOSIS — E876 Hypokalemia: Secondary | ICD-10-CM | POA: Diagnosis not present

## 2023-06-27 DIAGNOSIS — I340 Nonrheumatic mitral (valve) insufficiency: Secondary | ICD-10-CM | POA: Diagnosis not present

## 2023-06-27 DIAGNOSIS — R918 Other nonspecific abnormal finding of lung field: Secondary | ICD-10-CM | POA: Diagnosis not present

## 2023-06-27 DIAGNOSIS — Z452 Encounter for adjustment and management of vascular access device: Secondary | ICD-10-CM | POA: Diagnosis not present

## 2023-06-27 DIAGNOSIS — Z681 Body mass index (BMI) 19 or less, adult: Secondary | ICD-10-CM | POA: Diagnosis not present

## 2023-06-27 DIAGNOSIS — Z1152 Encounter for screening for COVID-19: Secondary | ICD-10-CM | POA: Diagnosis not present

## 2023-06-27 DIAGNOSIS — R531 Weakness: Secondary | ICD-10-CM | POA: Diagnosis not present

## 2023-06-27 DIAGNOSIS — Z7982 Long term (current) use of aspirin: Secondary | ICD-10-CM | POA: Diagnosis not present

## 2023-06-27 DIAGNOSIS — D6869 Other thrombophilia: Secondary | ICD-10-CM | POA: Diagnosis not present

## 2023-06-27 DIAGNOSIS — E860 Dehydration: Secondary | ICD-10-CM | POA: Diagnosis not present

## 2023-06-27 DIAGNOSIS — Z79890 Hormone replacement therapy (postmenopausal): Secondary | ICD-10-CM | POA: Diagnosis not present

## 2023-06-27 DIAGNOSIS — E039 Hypothyroidism, unspecified: Secondary | ICD-10-CM | POA: Diagnosis not present

## 2023-06-27 DIAGNOSIS — Z87891 Personal history of nicotine dependence: Secondary | ICD-10-CM | POA: Diagnosis not present

## 2023-06-27 DIAGNOSIS — F05 Delirium due to known physiological condition: Secondary | ICD-10-CM | POA: Diagnosis not present

## 2023-06-27 DIAGNOSIS — H409 Unspecified glaucoma: Secondary | ICD-10-CM | POA: Diagnosis not present

## 2023-06-27 DIAGNOSIS — Z66 Do not resuscitate: Secondary | ICD-10-CM | POA: Diagnosis not present

## 2023-06-27 DIAGNOSIS — I495 Sick sinus syndrome: Secondary | ICD-10-CM | POA: Diagnosis not present

## 2023-06-27 DIAGNOSIS — Z950 Presence of cardiac pacemaker: Secondary | ICD-10-CM | POA: Diagnosis not present

## 2023-06-27 DIAGNOSIS — J432 Centrilobular emphysema: Secondary | ICD-10-CM | POA: Diagnosis not present

## 2023-06-27 DIAGNOSIS — I482 Chronic atrial fibrillation, unspecified: Secondary | ICD-10-CM | POA: Diagnosis not present

## 2023-06-27 DIAGNOSIS — J439 Emphysema, unspecified: Secondary | ICD-10-CM | POA: Diagnosis not present

## 2023-06-27 DIAGNOSIS — J90 Pleural effusion, not elsewhere classified: Secondary | ICD-10-CM | POA: Diagnosis not present

## 2023-06-27 DIAGNOSIS — E440 Moderate protein-calorie malnutrition: Secondary | ICD-10-CM | POA: Diagnosis not present

## 2023-06-27 DIAGNOSIS — A419 Sepsis, unspecified organism: Secondary | ICD-10-CM | POA: Diagnosis not present

## 2023-06-27 DIAGNOSIS — R6520 Severe sepsis without septic shock: Secondary | ICD-10-CM | POA: Diagnosis not present

## 2023-06-27 DIAGNOSIS — R059 Cough, unspecified: Secondary | ICD-10-CM | POA: Diagnosis not present

## 2023-06-27 DIAGNOSIS — E870 Hyperosmolality and hypernatremia: Secondary | ICD-10-CM | POA: Diagnosis not present

## 2023-06-27 DIAGNOSIS — G9341 Metabolic encephalopathy: Secondary | ICD-10-CM | POA: Diagnosis not present

## 2023-06-27 DIAGNOSIS — I10 Essential (primary) hypertension: Secondary | ICD-10-CM | POA: Diagnosis not present

## 2023-06-27 DIAGNOSIS — R21 Rash and other nonspecific skin eruption: Secondary | ICD-10-CM | POA: Diagnosis not present

## 2023-06-27 DIAGNOSIS — B004 Herpesviral encephalitis: Secondary | ICD-10-CM | POA: Diagnosis not present

## 2023-06-27 DIAGNOSIS — Z96643 Presence of artificial hip joint, bilateral: Secondary | ICD-10-CM | POA: Diagnosis not present

## 2023-06-27 DIAGNOSIS — I081 Rheumatic disorders of both mitral and tricuspid valves: Secondary | ICD-10-CM | POA: Diagnosis not present

## 2023-06-27 DIAGNOSIS — F1090 Alcohol use, unspecified, uncomplicated: Secondary | ICD-10-CM | POA: Diagnosis not present

## 2023-06-27 DIAGNOSIS — R197 Diarrhea, unspecified: Secondary | ICD-10-CM | POA: Diagnosis not present

## 2023-06-27 DIAGNOSIS — R509 Fever, unspecified: Secondary | ICD-10-CM | POA: Diagnosis not present

## 2023-06-28 DIAGNOSIS — R918 Other nonspecific abnormal finding of lung field: Secondary | ICD-10-CM | POA: Diagnosis not present

## 2023-06-28 DIAGNOSIS — J90 Pleural effusion, not elsewhere classified: Secondary | ICD-10-CM | POA: Diagnosis not present

## 2023-06-28 DIAGNOSIS — J432 Centrilobular emphysema: Secondary | ICD-10-CM | POA: Diagnosis not present

## 2023-06-29 DIAGNOSIS — A419 Sepsis, unspecified organism: Secondary | ICD-10-CM | POA: Diagnosis not present

## 2023-07-08 ENCOUNTER — Other Ambulatory Visit: Payer: Self-pay | Admitting: Family Medicine

## 2023-07-09 NOTE — Telephone Encounter (Signed)
Requested Prescriptions  Pending Prescriptions Disp Refills   levothyroxine (SYNTHROID) 50 MCG tablet [Pharmacy Med Name: LEVOTHYROXINE SODIUM 50 MCG TAB] 90 tablet 0    Sig: TAKE 1 TABLET EVERY DAY ON EMPTY STOMACHWITH A GLASS OF WATER AT LEAST 30-60 MINBEFORE BREAKFAST     Endocrinology:  Hypothyroid Agents Passed - 07/08/2023  2:44 PM      Passed - TSH in normal range and within 360 days    TSH  Date Value Ref Range Status  05/06/2023 1.870 0.450 - 4.500 uIU/mL Final         Passed - Valid encounter within last 12 months    Recent Outpatient Visits           2 months ago Encounter for annual wellness visit (AWV) in Medicare patient   [facility] [place], Marzella Schlein, MD   5 months ago Benign essential HTN   [facility] [place], Marzella Schlein, MD   7 months ago Benign essential HTN    [facility] [address], Marzella Schlein, MD   1 year ago Encounter for Medicare annual wellness exam   [facility] Bosie Clos, MD   1 year ago Benign essential HTN   [facility] Bosie Clos, MD

## 2023-07-15 ENCOUNTER — Telehealth: Payer: Self-pay | Admitting: *Deleted

## 2023-07-15 NOTE — Telephone Encounter (Signed)
Patient update recent hospitalization.  Discharged from hospital today- needs follow up at office within 3 weeks. 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 Patient was also started on Coreg and has been advised to continue at discharge. Also ordered: Home health RN, PT, and O2  Follow up chest CT in 3 months recommended per incidental finding-see report.

## 2023-07-16 ENCOUNTER — Telehealth: Payer: Self-pay | Admitting: *Deleted

## 2023-07-16 NOTE — Transitions of Care (Post Inpatient/ED Visit) (Signed)
07/16/2023  Name: Savannah Tucker MRN: [id number] DOB: [birth year]  Today's TOC FU Call Status: Today's TOC FU Call Status:: Successful TOC FU Call Completed TOC FU Call Complete Date: 07/16/23  Transition Care Management Follow-up Telephone Call Date of Discharge: 07/15/23 Discharge Facility: Other ([facility]) Name of Other ([facility]) Discharge Facility: [facility] Type of Discharge: Inpatient Admission Primary Inpatient Discharge Diagnosis:: Sepsis How have you been since you were released from the hospital?: Better Any questions or concerns?: No  Items Reviewed: Did you receive and understand the discharge instructions provided?: Yes Medications obtained,verified, and reconciled?: Yes (Medications Reviewed) Any new allergies since your discharge?: No Dietary orders reviewed?: No Do you have support at home?: Yes People in Home: alone Name of Support/Comfort Primary Source: Sterling Big and daughter in law  Medications Reviewed Today: Medications Reviewed Today     Reviewed by Luella Cook, RN (Case Manager) on 07/16/23 at 1552  Med List Status: <None>   Medication Order Taking? Sig Documenting Provider Last Dose Status Informant  acetaminophen (TYLENOL) 500 MG tablet [id number] Yes Take 1,000 mg by mouth every 6 (six) hours as needed for moderate pain. [provider] Taking Active   aspirin 325 MG tablet [id number] Yes Take 325 mg by mouth.  [provider] Taking Active            Med Note Sherre Poot   Fri Nov 25, 2018  2:01 PM)    calcium carbonate (OS-CAL) 600 MG TABS tablet [id number] Yes Take 1,200 mg by mouth daily.  [provider] Taking Active            Med Note Sherre Poot   Fri Nov 25, 2018  2:01 PM)    cetirizine (ZYRTEC) 10 MG tablet [id number] Yes TAKE 1 TABLET BY MOUTH DAILY. Bosie Clos, MD Taking Active   levothyroxine (SYNTHROID) 50 MCG tablet [id number] Yes TAKE 1 TABLET EVERY DAY ON EMPTY STOMACHWITH A  GLASS OF WATER AT LEAST 30-60 MINBEFORE BREAKFAST Erasmo Downer, MD Taking Active   omeprazole (PRILOSEC) 20 MG capsule [id number] Yes Take 1 capsule (20 mg total) by mouth every morning. Bosie Clos, MD Taking Active   OVER THE COUNTER MEDICATION [id number] Yes 20 mg daily. Collagen 2 scoops [provider] Taking Active   timolol (TIMOPTIC) 0.5 % ophthalmic solution [id number] Yes Place 1 drop into both eyes daily.  [provider] Taking Active            Med Note Sherre Poot   Fri Nov 25, 2018  2:01 PM)              Home Care and Equipment/Supplies: Were Home Health Services Ordered?: Yes Name of Home Health Agency:: [facility] Has Agency set up a time to come to your home?: No EMR reviewed for Home Health Orders: Orders present/patient has not received call (refer to CM for follow-up) (RN gave daughter in law the phone number to call) Any new equipment or medical supplies ordered?: NA  Functional Questionnaire: Do you need assistance with bathing/showering or dressing?: No Do you need assistance with meal preparation?: Yes Do you need assistance with eating?: No Do you need assistance with getting out of bed/getting out of a chair/moving?: No Do you have difficulty managing or taking your medications?: No  Follow up appointments reviewed: PCP Follow-up appointment confirmed?: Yes Date of PCP follow-up appointment?: 07/23/23 Follow-up Provider: [facility] Follow-up appointment confirmed?: Yes Date of Specialist follow-up  appointment?: 08/04/23 Follow-Up Specialty Provider:: Dr Darrold Junker Do you need transportation to your follow-up appointment?: No Do you understand care options if your condition(s) worsen?: Yes-patient verbalized understanding  SDOH Interventions Today    Flowsheet Row Most Recent Value  SDOH Interventions   Food Insecurity Interventions Intervention Not Indicated  Housing Interventions  Intervention Not Indicated  Transportation Interventions Intervention Not Indicated, Patient Resources (Friends/Family)      Interventions Today    Flowsheet Row Most Recent Value  General Interventions   General Interventions Discussed/Reviewed General Interventions Discussed, General Interventions Reviewed, Doctor Visits  Pharmacy Interventions   Pharmacy Dicussed/Reviewed Pharmacy Topics Discussed      TOC Interventions Today    Flowsheet Row Most Recent Value  TOC Interventions   TOC Interventions Discussed/Reviewed TOC Interventions Discussed, TOC Interventions Reviewed, Contacted Home Health RN/OT/PT  [RN gave daughter in law phone number to follow up with [facility]]       Gean Maidens BSN RN [facility] [phone]

## 2023-07-16 NOTE — Transitions of Care (Post Inpatient/ED Visit) (Signed)
   07/16/2023  Name: Savannah Tucker MRN: [id number] DOB: September 14, 1930  Today's TOC FU Call Status: Today's TOC FU Call Status:: Unsuccessful Call (1st Attempt) Unsuccessful Call (1st Attempt) Date: 07/16/23  Attempted to reach the patient regarding the most recent Inpatient/ED visit.  Follow Up Plan: Additional outreach attempts will be made to reach the patient to complete the Transitions of Care (Post Inpatient/ED visit) call.   Gean Maidens BSN RN [facility] [phone]

## 2023-07-16 NOTE — Transitions of Care (Post Inpatient/ED Visit) (Signed)
   07/16/2023  Name: Savannah Tucker MRN: [id number] DOB: 08-09-1930  Today's TOC FU Call Status: Today's TOC FU Call Status:: Unsuccessful Call (2nd Attempt) Unsuccessful Call (2nd Attempt) Date: 07/16/23  Attempted to reach the patient regarding the most recent Inpatient/ED visit.  Follow Up Plan: Additional outreach attempts will be made to reach the patient to complete the Transitions of Care (Post Inpatient/ED visit) call.   Gean Maidens BSN RN [facility] [phone]

## 2023-07-19 DIAGNOSIS — I495 Sick sinus syndrome: Secondary | ICD-10-CM | POA: Diagnosis not present

## 2023-07-19 DIAGNOSIS — Z96649 Presence of unspecified artificial hip joint: Secondary | ICD-10-CM | POA: Diagnosis not present

## 2023-07-19 DIAGNOSIS — I482 Chronic atrial fibrillation, unspecified: Secondary | ICD-10-CM | POA: Diagnosis not present

## 2023-07-19 DIAGNOSIS — E876 Hypokalemia: Secondary | ICD-10-CM | POA: Diagnosis not present

## 2023-07-19 DIAGNOSIS — E440 Moderate protein-calorie malnutrition: Secondary | ICD-10-CM | POA: Diagnosis not present

## 2023-07-19 DIAGNOSIS — I10 Essential (primary) hypertension: Secondary | ICD-10-CM | POA: Diagnosis not present

## 2023-07-19 DIAGNOSIS — J439 Emphysema, unspecified: Secondary | ICD-10-CM | POA: Diagnosis not present

## 2023-07-19 DIAGNOSIS — R5383 Other fatigue: Secondary | ICD-10-CM | POA: Diagnosis not present

## 2023-07-19 DIAGNOSIS — F1090 Alcohol use, unspecified, uncomplicated: Secondary | ICD-10-CM | POA: Diagnosis not present

## 2023-07-19 DIAGNOSIS — Z7982 Long term (current) use of aspirin: Secondary | ICD-10-CM | POA: Diagnosis not present

## 2023-07-19 DIAGNOSIS — R410 Disorientation, unspecified: Secondary | ICD-10-CM | POA: Diagnosis not present

## 2023-07-19 DIAGNOSIS — E039 Hypothyroidism, unspecified: Secondary | ICD-10-CM | POA: Diagnosis not present

## 2023-07-19 DIAGNOSIS — Z87891 Personal history of nicotine dependence: Secondary | ICD-10-CM | POA: Diagnosis not present

## 2023-07-19 DIAGNOSIS — I081 Rheumatic disorders of both mitral and tricuspid valves: Secondary | ICD-10-CM | POA: Diagnosis not present

## 2023-07-19 DIAGNOSIS — Z556 Problems related to health literacy: Secondary | ICD-10-CM | POA: Diagnosis not present

## 2023-07-19 DIAGNOSIS — A419 Sepsis, unspecified organism: Secondary | ICD-10-CM | POA: Diagnosis not present

## 2023-07-19 DIAGNOSIS — Z604 Social exclusion and rejection: Secondary | ICD-10-CM | POA: Diagnosis not present

## 2023-07-19 DIAGNOSIS — G9341 Metabolic encephalopathy: Secondary | ICD-10-CM | POA: Diagnosis not present

## 2023-07-19 DIAGNOSIS — Z950 Presence of cardiac pacemaker: Secondary | ICD-10-CM | POA: Diagnosis not present

## 2023-07-21 ENCOUNTER — Telehealth: Payer: Self-pay

## 2023-07-21 DIAGNOSIS — E440 Moderate protein-calorie malnutrition: Secondary | ICD-10-CM | POA: Diagnosis not present

## 2023-07-21 DIAGNOSIS — I081 Rheumatic disorders of both mitral and tricuspid valves: Secondary | ICD-10-CM | POA: Diagnosis not present

## 2023-07-21 DIAGNOSIS — A419 Sepsis, unspecified organism: Secondary | ICD-10-CM | POA: Diagnosis not present

## 2023-07-21 DIAGNOSIS — J439 Emphysema, unspecified: Secondary | ICD-10-CM | POA: Diagnosis not present

## 2023-07-21 DIAGNOSIS — I482 Chronic atrial fibrillation, unspecified: Secondary | ICD-10-CM | POA: Diagnosis not present

## 2023-07-21 DIAGNOSIS — I495 Sick sinus syndrome: Secondary | ICD-10-CM | POA: Diagnosis not present

## 2023-07-21 NOTE — Telephone Encounter (Signed)
Copied from CRM [id number]. Topic: General - Other >> Jul 21, 2023  2:47 PM Phill Myron wrote: Home Health Verbal Orders - Caller/Agency: Thayer Ohm with [facility]  Callback Number: [phone] Requesting PT Frequency: [phone]

## 2023-07-22 DIAGNOSIS — A419 Sepsis, unspecified organism: Secondary | ICD-10-CM | POA: Diagnosis not present

## 2023-07-22 DIAGNOSIS — I081 Rheumatic disorders of both mitral and tricuspid valves: Secondary | ICD-10-CM | POA: Diagnosis not present

## 2023-07-22 DIAGNOSIS — I482 Chronic atrial fibrillation, unspecified: Secondary | ICD-10-CM | POA: Diagnosis not present

## 2023-07-22 DIAGNOSIS — J439 Emphysema, unspecified: Secondary | ICD-10-CM | POA: Diagnosis not present

## 2023-07-22 DIAGNOSIS — E440 Moderate protein-calorie malnutrition: Secondary | ICD-10-CM | POA: Diagnosis not present

## 2023-07-22 DIAGNOSIS — I495 Sick sinus syndrome: Secondary | ICD-10-CM | POA: Diagnosis not present

## 2023-07-22 NOTE — Telephone Encounter (Signed)
OK for verbals

## 2023-07-23 ENCOUNTER — Ambulatory Visit (INDEPENDENT_AMBULATORY_CARE_PROVIDER_SITE_OTHER): Payer: Medicare Other | Admitting: Family Medicine

## 2023-07-23 ENCOUNTER — Encounter: Payer: Self-pay | Admitting: Family Medicine

## 2023-07-23 VITALS — BP 156/76 | HR 76 | Ht 63.0 in | Wt 106.3 lb

## 2023-07-23 DIAGNOSIS — D229 Melanocytic nevi, unspecified: Secondary | ICD-10-CM

## 2023-07-23 DIAGNOSIS — I482 Chronic atrial fibrillation, unspecified: Secondary | ICD-10-CM

## 2023-07-23 DIAGNOSIS — R636 Underweight: Secondary | ICD-10-CM

## 2023-07-23 DIAGNOSIS — A419 Sepsis, unspecified organism: Secondary | ICD-10-CM | POA: Diagnosis not present

## 2023-07-23 DIAGNOSIS — K219 Gastro-esophageal reflux disease without esophagitis: Secondary | ICD-10-CM | POA: Diagnosis not present

## 2023-07-23 DIAGNOSIS — M5416 Radiculopathy, lumbar region: Secondary | ICD-10-CM

## 2023-07-23 DIAGNOSIS — I10 Essential (primary) hypertension: Secondary | ICD-10-CM

## 2023-07-23 DIAGNOSIS — M810 Age-related osteoporosis without current pathological fracture: Secondary | ICD-10-CM | POA: Diagnosis not present

## 2023-07-23 MED ORDER — OMEPRAZOLE 20 MG PO CPDR
20.0000 mg | DELAYED_RELEASE_CAPSULE | ORAL | 5 refills | Status: DC
Start: 2023-07-23 — End: 2024-05-11

## 2023-07-23 NOTE — Progress Notes (Signed)
Established Patient Office Visit  Subjective   Patient ID: Savannah Tucker Short, female    DOB: 1930/02/14  Age: 89 y.o. MRN: [id number]  Chief Complaint  Patient presents with   Follow-up    Patient having problem swallowing, food gets stuck, would like a dermatology referral for a mole on the neck and spots on the legs, would like a handicap sticker     HPI  Discussed the use of AI scribe software for clinical note transcription with the patient, who gave verbal consent to proceed.  History of Present Illness   A 89 year old patient with a history of HSV encephalitis and delirium, recently hospitalized for sepsis and weakness, presents for follow-up. She reports some difficulty swallowing and feeling like food gets stuck, although this has improved recently. She has been eating small meals throughout the day, with dinner being her largest meal. She also mentions some memory issues, such as imagining receiving a bill in the mail that she did not actually receive until the next day. This is improving since discharge. Her blood pressures have been elevated, and her carvedilol dose was increased to 12.5mg  twice daily during her hospitalization. Her daughter-in-law has been monitoring her blood pressure and pulse at home. The patient also mentions a desire for a referral to a dermatologist for a mole and some spots on her leg.         ROS    Objective:     BP (!) 156/76 (BP Location: Left Arm, Patient Position: Sitting, Cuff Size: Normal)   Pulse 76   Ht 5\' 3"  (1.6 m)   Wt 106 lb 4.8 oz (48.2 kg)   SpO2 (!) 87%   BMI 18.83 kg/m    Physical Exam Vitals reviewed.  Constitutional:      General: She is not in acute distress.    Appearance: Normal appearance. She is well-developed. She is not diaphoretic.  HENT:     Head: Normocephalic and atraumatic.  Eyes:     General: No scleral icterus.    Conjunctiva/sclera: Conjunctivae normal.  Neck:     Thyroid: No thyromegaly.   Cardiovascular:     Rate and Rhythm: Normal rate and regular rhythm.     Heart sounds: Normal heart sounds.  Pulmonary:     Effort: Pulmonary effort is normal. No respiratory distress.     Breath sounds: Normal breath sounds. No wheezing, rhonchi or rales.  Musculoskeletal:     Cervical back: Neck supple.     Right lower leg: No edema.     Left lower leg: No edema.  Lymphadenopathy:     Cervical: No cervical adenopathy.  Skin:    General: Skin is warm and dry.     Findings: No rash.  Neurological:     Mental Status: She is alert and oriented to person, place, and time. Mental status is at baseline.  Psychiatric:        Mood and Affect: Mood normal.        Behavior: Behavior normal.      No results found for any visits on 07/23/23.    The ASCVD Risk score (Arnett DK, et al., 2019) failed to calculate for the following reasons:   The 2019 ASCVD risk score is only valid for ages 21 to 75    Assessment & Plan:   Problem List Items Addressed This Visit       Cardiovascular and Mediastinum   Chronic atrial fibrillation (HCC)   Relevant Medications   carvedilol (  COREG) 12.5 MG tablet   Benign essential HTN - Primary   Relevant Medications   carvedilol (COREG) 12.5 MG tablet   Other Visit Diagnoses     Sepsis without acute organ dysfunction, due to unspecified organism (HCC)       Lumbar radiculopathy       Relevant Medications   omeprazole (PRILOSEC) 20 MG capsule   Osteoporosis, unspecified osteoporosis type, unspecified pathological fracture presence       Relevant Medications   omeprazole (PRILOSEC) 20 MG capsule   Nevus       Relevant Orders   Ambulatory referral to Dermatology   Gastroesophageal reflux disease, unspecified whether esophagitis present       Relevant Medications   omeprazole (PRILOSEC) 20 MG capsule   Underweight               Herpes Simplex Virus (HSV) Encephalitis Recently treated with a 14-day course of renally dosed IV acyclovir.  Resolved altered mental status, but reports residual cognitive fog and occasional hallucinations. -Continue to monitor cognitive function.  Hypertension Elevated blood pressures noted during recent hospitalization. Carvedilol dose increased to 12.5mg  BID. Recent home readings variable, some readings in the 150s. -Continue Carvedilol 12.5mg  BID. -Monitor blood pressure at home. If consistently above 150, consider adding a low-dose antihypertensive.  Gastroesophageal Reflux Disease (GERD) Reports feeling like food gets stuck and not feeling like eating. History of taking Prilosec, but not recently. -Start Prilosec 20mg  daily for 2 weeks. If needed longer, continue use. -If resuming calcium supplement, take at a different time of day from Prilosec to ensure absorption.  General Health Maintenance / Followup Plans -Physical therapy ongoing at home. -Referral to dermatology for mole evaluation. -Complete and submit form for handicap placard. -Plan for annual follow-up visit, or sooner if blood pressure trends consistently above 150.        Return for as scheduled.    Shirlee Latch, MD

## 2023-07-26 NOTE — Telephone Encounter (Signed)
Left detailed message on voicemail advising of approved orders.

## 2023-07-29 DIAGNOSIS — A419 Sepsis, unspecified organism: Secondary | ICD-10-CM | POA: Diagnosis not present

## 2023-07-29 DIAGNOSIS — I482 Chronic atrial fibrillation, unspecified: Secondary | ICD-10-CM | POA: Diagnosis not present

## 2023-07-29 DIAGNOSIS — I081 Rheumatic disorders of both mitral and tricuspid valves: Secondary | ICD-10-CM | POA: Diagnosis not present

## 2023-07-29 DIAGNOSIS — I495 Sick sinus syndrome: Secondary | ICD-10-CM | POA: Diagnosis not present

## 2023-07-29 DIAGNOSIS — E440 Moderate protein-calorie malnutrition: Secondary | ICD-10-CM | POA: Diagnosis not present

## 2023-07-29 DIAGNOSIS — J439 Emphysema, unspecified: Secondary | ICD-10-CM | POA: Diagnosis not present

## 2023-08-02 ENCOUNTER — Ambulatory Visit: Admission: RE | Admit: 2023-08-02 | Payer: Medicare Other | Source: Ambulatory Visit

## 2023-08-02 ENCOUNTER — Other Ambulatory Visit (HOSPITAL_COMMUNITY): Payer: Self-pay | Admitting: Physician Assistant

## 2023-08-02 DIAGNOSIS — I482 Chronic atrial fibrillation, unspecified: Secondary | ICD-10-CM | POA: Diagnosis not present

## 2023-08-02 DIAGNOSIS — E440 Moderate protein-calorie malnutrition: Secondary | ICD-10-CM | POA: Diagnosis not present

## 2023-08-02 DIAGNOSIS — M4856XA Collapsed vertebra, not elsewhere classified, lumbar region, initial encounter for fracture: Secondary | ICD-10-CM | POA: Diagnosis not present

## 2023-08-02 DIAGNOSIS — A419 Sepsis, unspecified organism: Secondary | ICD-10-CM | POA: Diagnosis not present

## 2023-08-02 DIAGNOSIS — J439 Emphysema, unspecified: Secondary | ICD-10-CM | POA: Diagnosis not present

## 2023-08-02 DIAGNOSIS — I495 Sick sinus syndrome: Secondary | ICD-10-CM | POA: Diagnosis not present

## 2023-08-02 DIAGNOSIS — I081 Rheumatic disorders of both mitral and tricuspid valves: Secondary | ICD-10-CM | POA: Diagnosis not present

## 2023-08-02 DIAGNOSIS — M47896 Other spondylosis, lumbar region: Secondary | ICD-10-CM | POA: Diagnosis not present

## 2023-08-02 DIAGNOSIS — M47816 Spondylosis without myelopathy or radiculopathy, lumbar region: Secondary | ICD-10-CM | POA: Diagnosis not present

## 2023-08-02 DIAGNOSIS — S300XXA Contusion of lower back and pelvis, initial encounter: Secondary | ICD-10-CM | POA: Diagnosis not present

## 2023-08-04 DIAGNOSIS — I340 Nonrheumatic mitral (valve) insufficiency: Secondary | ICD-10-CM | POA: Diagnosis not present

## 2023-08-04 DIAGNOSIS — I38 Endocarditis, valve unspecified: Secondary | ICD-10-CM | POA: Diagnosis not present

## 2023-08-04 DIAGNOSIS — I495 Sick sinus syndrome: Secondary | ICD-10-CM | POA: Diagnosis not present

## 2023-08-04 DIAGNOSIS — Z950 Presence of cardiac pacemaker: Secondary | ICD-10-CM | POA: Diagnosis not present

## 2023-08-04 DIAGNOSIS — I10 Essential (primary) hypertension: Secondary | ICD-10-CM | POA: Diagnosis not present

## 2023-08-04 DIAGNOSIS — I482 Chronic atrial fibrillation, unspecified: Secondary | ICD-10-CM | POA: Diagnosis not present

## 2023-08-09 DIAGNOSIS — I081 Rheumatic disorders of both mitral and tricuspid valves: Secondary | ICD-10-CM | POA: Diagnosis not present

## 2023-08-09 DIAGNOSIS — E440 Moderate protein-calorie malnutrition: Secondary | ICD-10-CM | POA: Diagnosis not present

## 2023-08-09 DIAGNOSIS — A419 Sepsis, unspecified organism: Secondary | ICD-10-CM | POA: Diagnosis not present

## 2023-08-09 DIAGNOSIS — I495 Sick sinus syndrome: Secondary | ICD-10-CM | POA: Diagnosis not present

## 2023-08-09 DIAGNOSIS — I482 Chronic atrial fibrillation, unspecified: Secondary | ICD-10-CM | POA: Diagnosis not present

## 2023-08-09 DIAGNOSIS — J439 Emphysema, unspecified: Secondary | ICD-10-CM | POA: Diagnosis not present

## 2023-08-10 ENCOUNTER — Ambulatory Visit
Admission: RE | Admit: 2023-08-10 | Discharge: 2023-08-10 | Disposition: A | Discharge status: Home / Self Care | Payer: Medicare Other | Source: Ambulatory Visit | Attending: Physician Assistant | Admitting: Physician Assistant

## 2023-08-10 DIAGNOSIS — M47896 Other spondylosis, lumbar region: Secondary | ICD-10-CM | POA: Diagnosis not present

## 2023-08-10 DIAGNOSIS — M4856XA Collapsed vertebra, not elsewhere classified, lumbar region, initial encounter for fracture: Secondary | ICD-10-CM | POA: Diagnosis not present

## 2023-08-10 DIAGNOSIS — M47816 Spondylosis without myelopathy or radiculopathy, lumbar region: Secondary | ICD-10-CM | POA: Diagnosis not present

## 2023-08-10 DIAGNOSIS — R29890 Loss of height: Secondary | ICD-10-CM | POA: Diagnosis not present

## 2023-08-10 DIAGNOSIS — M5136 Other intervertebral disc degeneration, lumbar region: Secondary | ICD-10-CM | POA: Diagnosis not present

## 2023-08-11 ENCOUNTER — Encounter: Payer: Self-pay | Admitting: Family Medicine

## 2023-08-11 DIAGNOSIS — J90 Pleural effusion, not elsewhere classified: Secondary | ICD-10-CM

## 2023-08-12 NOTE — Telephone Encounter (Signed)
Please order f/u CXR per message. Thanks!

## 2023-08-17 ENCOUNTER — Encounter: Payer: Self-pay | Admitting: Dermatology

## 2023-08-17 ENCOUNTER — Ambulatory Visit (INDEPENDENT_AMBULATORY_CARE_PROVIDER_SITE_OTHER): Payer: Medicare Other | Admitting: Dermatology

## 2023-08-17 DIAGNOSIS — T148XXA Other injury of unspecified body region, initial encounter: Secondary | ICD-10-CM

## 2023-08-17 DIAGNOSIS — L720 Epidermal cyst: Secondary | ICD-10-CM | POA: Diagnosis not present

## 2023-08-17 DIAGNOSIS — L821 Other seborrheic keratosis: Secondary | ICD-10-CM

## 2023-08-17 DIAGNOSIS — L578 Other skin changes due to chronic exposure to nonionizing radiation: Secondary | ICD-10-CM | POA: Diagnosis not present

## 2023-08-17 DIAGNOSIS — L814 Other melanin hyperpigmentation: Secondary | ICD-10-CM | POA: Diagnosis not present

## 2023-08-17 DIAGNOSIS — D692 Other nonthrombocytopenic purpura: Secondary | ICD-10-CM | POA: Diagnosis not present

## 2023-08-17 DIAGNOSIS — D229 Melanocytic nevi, unspecified: Secondary | ICD-10-CM | POA: Diagnosis not present

## 2023-08-17 DIAGNOSIS — D1801 Hemangioma of skin and subcutaneous tissue: Secondary | ICD-10-CM

## 2023-08-17 NOTE — Progress Notes (Signed)
   New Patient Visit   Subjective  Savannah Tucker is a 89 y.o. female who presents for the following: Irregular skin lesions on the neck, back, and legs, patient concerned and would like them checked today. The patient has spots, moles and lesions to be evaluated, some may be new or changing and the patient may have concern these could be cancer.  The following portions of the chart were reviewed this encounter and updated as appropriate: medications, allergies, medical history  Review of Systems:  No other skin or systemic complaints except as noted in HPI or Assessment and Plan.  Objective  Well appearing patient in no apparent distress; mood and affect are within normal limits.   A focused examination was performed of the following areas:   Relevant exam findings are noted in the Assessment and Plan.   Assessment & Plan   SEBORRHEIC KERATOSIS - neck, legs, back - Stuck-on, waxy, tan-brown papules and/or plaques  - Benign-appearing - Discussed benign etiology and prognosis. - Observe - Call for any changes - offered cryo or shave removal. Patient defers today  ACTINIC DAMAGE - chronic, secondary to cumulative UV radiation exposure/sun exposure over time - diffuse scaly erythematous macules with underlying dyspigmentation - Recommend daily broad spectrum sunscreen SPF 30+ to sun-exposed areas, reapply every 2 hours as needed.  - Recommend staying in the shade or wearing long sleeves, sun glasses (UVA+UVB protection) and wide brim hats (4-inch brim around the entire circumference of the hat). - Call for new or changing lesions.  LENTIGINES Exam: scattered tan macules Due to sun exposure Treatment Plan: Benign-appearing, observe. Recommend daily broad spectrum sunscreen SPF 30+ to sun-exposed areas, reapply every 2 hours as needed.  Call for any changes  MELANOCYTIC NEVI Exam: Tan-brown and/or pink-flesh-colored symmetric macules and papules  Treatment Plan: Benign  appearing on exam today. Recommend observation. Call clinic for new or changing moles. Recommend daily use of broad spectrum spf 30+ sunscreen to sun-exposed areas.   Purpura - Chronic; persistent and recurrent.  Treatable, but not curable. - Violaceous macules and patches - Benign - Related to trauma, age, sun damage and/or use of blood thinners, chronic use of topical and/or oral steroids - Observe - Can use OTC arnica containing moisturizer such as Dermend Bruise Formula if desired - Call for worsening or other concerns  Hematoma from recent fall - Purple nodule of R hand - Benign, observe, no treatment needed.   EPIDERMAL INCLUSION CYST Exam: Subcutaneous nodule at back  Benign-appearing. Exam most consistent with an epidermal inclusion cyst. Discussed that a cyst is a benign growth that can grow over time and sometimes get irritated or inflamed. Recommend observation if it is not bothersome. Discussed option of surgical excision to remove it if it is growing, symptomatic, or other changes noted. Please call for new or changing lesions so they can be evaluated. Avoid manipulating given risk of cyst wall rupture and inflammation  Return if symptoms worsen or fail to improve.  Maylene Roes, CMA, am acting as scribe for Elie Goody, MD .  Documentation: I have reviewed the above documentation for accuracy and completeness, and I agree with the above.  Elie Goody, MD

## 2023-08-17 NOTE — Patient Instructions (Signed)

## 2023-08-18 DIAGNOSIS — Z556 Problems related to health literacy: Secondary | ICD-10-CM | POA: Diagnosis not present

## 2023-08-18 DIAGNOSIS — Z7982 Long term (current) use of aspirin: Secondary | ICD-10-CM | POA: Diagnosis not present

## 2023-08-18 DIAGNOSIS — A419 Sepsis, unspecified organism: Secondary | ICD-10-CM | POA: Diagnosis not present

## 2023-08-18 DIAGNOSIS — J439 Emphysema, unspecified: Secondary | ICD-10-CM | POA: Diagnosis not present

## 2023-08-18 DIAGNOSIS — R5383 Other fatigue: Secondary | ICD-10-CM | POA: Diagnosis not present

## 2023-08-18 DIAGNOSIS — I10 Essential (primary) hypertension: Secondary | ICD-10-CM | POA: Diagnosis not present

## 2023-08-18 DIAGNOSIS — Z604 Social exclusion and rejection: Secondary | ICD-10-CM | POA: Diagnosis not present

## 2023-08-18 DIAGNOSIS — I081 Rheumatic disorders of both mitral and tricuspid valves: Secondary | ICD-10-CM | POA: Diagnosis not present

## 2023-08-18 DIAGNOSIS — I482 Chronic atrial fibrillation, unspecified: Secondary | ICD-10-CM | POA: Diagnosis not present

## 2023-08-18 DIAGNOSIS — F1090 Alcohol use, unspecified, uncomplicated: Secondary | ICD-10-CM | POA: Diagnosis not present

## 2023-08-18 DIAGNOSIS — Z96649 Presence of unspecified artificial hip joint: Secondary | ICD-10-CM | POA: Diagnosis not present

## 2023-08-18 DIAGNOSIS — Z87891 Personal history of nicotine dependence: Secondary | ICD-10-CM | POA: Diagnosis not present

## 2023-08-18 DIAGNOSIS — E876 Hypokalemia: Secondary | ICD-10-CM | POA: Diagnosis not present

## 2023-08-18 DIAGNOSIS — I495 Sick sinus syndrome: Secondary | ICD-10-CM | POA: Diagnosis not present

## 2023-08-18 DIAGNOSIS — G9341 Metabolic encephalopathy: Secondary | ICD-10-CM | POA: Diagnosis not present

## 2023-08-18 DIAGNOSIS — R410 Disorientation, unspecified: Secondary | ICD-10-CM | POA: Diagnosis not present

## 2023-08-18 DIAGNOSIS — E039 Hypothyroidism, unspecified: Secondary | ICD-10-CM | POA: Diagnosis not present

## 2023-08-18 DIAGNOSIS — Z950 Presence of cardiac pacemaker: Secondary | ICD-10-CM | POA: Diagnosis not present

## 2023-08-18 DIAGNOSIS — E440 Moderate protein-calorie malnutrition: Secondary | ICD-10-CM | POA: Diagnosis not present

## 2023-08-20 DIAGNOSIS — M47816 Spondylosis without myelopathy or radiculopathy, lumbar region: Secondary | ICD-10-CM | POA: Diagnosis not present

## 2023-08-23 DIAGNOSIS — Z961 Presence of intraocular lens: Secondary | ICD-10-CM | POA: Diagnosis not present

## 2023-08-23 DIAGNOSIS — H401131 Primary open-angle glaucoma, bilateral, mild stage: Secondary | ICD-10-CM | POA: Diagnosis not present

## 2023-08-23 DIAGNOSIS — H353132 Nonexudative age-related macular degeneration, bilateral, intermediate dry stage: Secondary | ICD-10-CM | POA: Diagnosis not present

## 2023-08-24 DIAGNOSIS — E440 Moderate protein-calorie malnutrition: Secondary | ICD-10-CM | POA: Diagnosis not present

## 2023-08-24 DIAGNOSIS — A419 Sepsis, unspecified organism: Secondary | ICD-10-CM | POA: Diagnosis not present

## 2023-08-24 DIAGNOSIS — I081 Rheumatic disorders of both mitral and tricuspid valves: Secondary | ICD-10-CM | POA: Diagnosis not present

## 2023-08-24 DIAGNOSIS — I495 Sick sinus syndrome: Secondary | ICD-10-CM | POA: Diagnosis not present

## 2023-08-24 DIAGNOSIS — J439 Emphysema, unspecified: Secondary | ICD-10-CM | POA: Diagnosis not present

## 2023-08-24 DIAGNOSIS — I482 Chronic atrial fibrillation, unspecified: Secondary | ICD-10-CM | POA: Diagnosis not present

## 2023-08-30 ENCOUNTER — Ambulatory Visit: Payer: Medicare Other

## 2023-08-30 DIAGNOSIS — E440 Moderate protein-calorie malnutrition: Secondary | ICD-10-CM | POA: Diagnosis not present

## 2023-08-30 DIAGNOSIS — Z719 Counseling, unspecified: Secondary | ICD-10-CM

## 2023-08-30 DIAGNOSIS — J439 Emphysema, unspecified: Secondary | ICD-10-CM | POA: Diagnosis not present

## 2023-08-30 DIAGNOSIS — Z23 Encounter for immunization: Secondary | ICD-10-CM | POA: Diagnosis not present

## 2023-08-30 DIAGNOSIS — I10 Essential (primary) hypertension: Secondary | ICD-10-CM | POA: Diagnosis not present

## 2023-08-30 DIAGNOSIS — F1090 Alcohol use, unspecified, uncomplicated: Secondary | ICD-10-CM | POA: Diagnosis not present

## 2023-08-30 DIAGNOSIS — G9341 Metabolic encephalopathy: Secondary | ICD-10-CM | POA: Diagnosis not present

## 2023-08-30 DIAGNOSIS — A419 Sepsis, unspecified organism: Secondary | ICD-10-CM | POA: Diagnosis not present

## 2023-08-30 DIAGNOSIS — I081 Rheumatic disorders of both mitral and tricuspid valves: Secondary | ICD-10-CM | POA: Diagnosis not present

## 2023-08-30 DIAGNOSIS — E039 Hypothyroidism, unspecified: Secondary | ICD-10-CM | POA: Diagnosis not present

## 2023-08-30 DIAGNOSIS — I495 Sick sinus syndrome: Secondary | ICD-10-CM | POA: Diagnosis not present

## 2023-08-30 DIAGNOSIS — E876 Hypokalemia: Secondary | ICD-10-CM | POA: Diagnosis not present

## 2023-08-30 DIAGNOSIS — R5383 Other fatigue: Secondary | ICD-10-CM | POA: Diagnosis not present

## 2023-08-30 DIAGNOSIS — I482 Chronic atrial fibrillation, unspecified: Secondary | ICD-10-CM | POA: Diagnosis not present

## 2023-08-30 NOTE — Progress Notes (Signed)
Patient seen in vehicle for COVID vaccination.  2024-25 Comirnaty +12Y IM left deltoid. Tolerated well. VIS provided. [facility] updated and copy provided.

## 2023-09-09 DIAGNOSIS — E440 Moderate protein-calorie malnutrition: Secondary | ICD-10-CM | POA: Diagnosis not present

## 2023-09-09 DIAGNOSIS — I482 Chronic atrial fibrillation, unspecified: Secondary | ICD-10-CM | POA: Diagnosis not present

## 2023-09-09 DIAGNOSIS — I495 Sick sinus syndrome: Secondary | ICD-10-CM | POA: Diagnosis not present

## 2023-09-09 DIAGNOSIS — A419 Sepsis, unspecified organism: Secondary | ICD-10-CM | POA: Diagnosis not present

## 2023-09-09 DIAGNOSIS — I081 Rheumatic disorders of both mitral and tricuspid valves: Secondary | ICD-10-CM | POA: Diagnosis not present

## 2023-09-09 DIAGNOSIS — J439 Emphysema, unspecified: Secondary | ICD-10-CM | POA: Diagnosis not present

## 2023-09-15 DIAGNOSIS — I482 Chronic atrial fibrillation, unspecified: Secondary | ICD-10-CM | POA: Diagnosis not present

## 2023-09-15 DIAGNOSIS — I495 Sick sinus syndrome: Secondary | ICD-10-CM | POA: Diagnosis not present

## 2023-09-15 DIAGNOSIS — I081 Rheumatic disorders of both mitral and tricuspid valves: Secondary | ICD-10-CM | POA: Diagnosis not present

## 2023-09-15 DIAGNOSIS — E440 Moderate protein-calorie malnutrition: Secondary | ICD-10-CM | POA: Diagnosis not present

## 2023-09-15 DIAGNOSIS — J439 Emphysema, unspecified: Secondary | ICD-10-CM | POA: Diagnosis not present

## 2023-09-15 DIAGNOSIS — A419 Sepsis, unspecified organism: Secondary | ICD-10-CM | POA: Diagnosis not present

## 2023-09-25 ENCOUNTER — Encounter: Payer: Self-pay | Admitting: Family Medicine

## 2023-09-28 ENCOUNTER — Other Ambulatory Visit: Payer: Self-pay | Admitting: Family Medicine

## 2023-09-29 ENCOUNTER — Telehealth: Payer: Self-pay

## 2023-09-29 NOTE — Telephone Encounter (Signed)
Requested Prescriptions  Pending Prescriptions Disp Refills   levothyroxine (SYNTHROID) 50 MCG tablet [Pharmacy Med Name: LEVOTHYROXINE SODIUM 50 MCG TAB] 90 tablet 0    Sig: TAKE 1 TABLET EVERY DAY ON EMPTY STOMACHWITH A GLASS OF WATER AT LEAST 30-60 MINBEFORE BREAKFAST     Endocrinology:  Hypothyroid Agents Passed - 09/28/2023  3:58 PM      Passed - TSH in normal range and within 360 days    TSH  Date Value Ref Range Status  05/06/2023 1.870 0.450 - 4.500 uIU/mL Final         Passed - Valid encounter within last 12 months    Recent Outpatient Visits           2 months ago Benign essential HTN   [place] [facility] [place], Marzella Schlein, MD   4 months ago Encounter for annual wellness visit (AWV) in Medicare patient   [facility] [place], Marzella Schlein, MD   7 months ago Benign essential HTN   [place] [facility] [place], Marzella Schlein, MD   10 months ago Benign essential HTN   [place] [facility] [place], Marzella Schlein, MD   1 year ago Encounter for Medicare annual wellness exam   [facility] Bosie Clos, MD

## 2023-09-29 NOTE — Telephone Encounter (Unsigned)
Copied from CRM [id number]. Topic: General - Other >> Sep 28, 2023  4:59 PM Turkey B wrote: Reason for CRM: pt's daughter n law called in says was told pt should have a repea chest xray. Rober Minion there need to be another order put in for this? She says it states in [place] to have a repeat done before November

## 2023-09-30 NOTE — Telephone Encounter (Signed)
It's already ordered. She just needs to go to [facility] and have it done.

## 2023-09-30 NOTE — Telephone Encounter (Signed)
Advised

## 2023-10-04 ENCOUNTER — Ambulatory Visit
Admission: RE | Admit: 2023-10-04 | Discharge: 2023-10-04 | Disposition: A | Discharge status: Home / Self Care | Payer: Medicare Other | Attending: Family Medicine | Admitting: Family Medicine

## 2023-10-04 ENCOUNTER — Ambulatory Visit
Admission: RE | Admit: 2023-10-04 | Discharge: 2023-10-04 | Disposition: A | Discharge status: Home / Self Care | Payer: Medicare Other | Source: Ambulatory Visit | Attending: Family Medicine

## 2023-10-04 DIAGNOSIS — I700 Atherosclerosis of aorta: Secondary | ICD-10-CM | POA: Diagnosis not present

## 2023-10-04 DIAGNOSIS — J90 Pleural effusion, not elsewhere classified: Secondary | ICD-10-CM

## 2023-10-04 DIAGNOSIS — Z950 Presence of cardiac pacemaker: Secondary | ICD-10-CM | POA: Diagnosis not present

## 2023-10-19 DIAGNOSIS — Z23 Encounter for immunization: Secondary | ICD-10-CM | POA: Diagnosis not present

## 2023-10-26 ENCOUNTER — Encounter: Payer: Self-pay | Admitting: Family Medicine

## 2023-11-23 DIAGNOSIS — I495 Sick sinus syndrome: Secondary | ICD-10-CM | POA: Diagnosis not present

## 2023-11-24 ENCOUNTER — Encounter: Payer: Self-pay | Admitting: Family Medicine

## 2023-11-24 NOTE — Telephone Encounter (Signed)
Pt declined appt at this time.  She states that she is going to try medication from the drug store first and it that does not work she will call back for an appt.

## 2023-12-29 ENCOUNTER — Other Ambulatory Visit: Payer: Self-pay | Admitting: Family Medicine

## 2023-12-30 ENCOUNTER — Other Ambulatory Visit: Payer: Self-pay | Admitting: Family Medicine

## 2023-12-30 NOTE — Telephone Encounter (Signed)
Requested Prescriptions  Pending Prescriptions Disp Refills   levothyroxine (SYNTHROID) 50 MCG tablet [Pharmacy Med Name: LEVOTHYROXINE SODIUM 50 MCG TAB] 90 tablet 0    Sig: TAKE 1 TABLET EVERY DAY ON EMPTY STOMACHWITH A GLASS OF WATER AT LEAST 30-60 MINBEFORE BREAKFAST     Endocrinology:  Hypothyroid Agents Passed - 12/30/2023 10:53 AM      Passed - TSH in normal range and within 360 days    TSH  Date Value Ref Range Status  05/06/2023 1.870 0.450 - 4.500 uIU/mL Final         Passed - Valid encounter within last 12 months    Recent Outpatient Visits           5 months ago Benign essential HTN   [facility] [place], Marzella Schlein, MD   7 months ago Encounter for annual wellness visit (AWV) in Medicare patient   [facility] [place], Marzella Schlein, MD   10 months ago Benign essential HTN   [facility] [place], Marzella Schlein, MD   1 year ago Benign essential HTN   [place] [facility] [place], Marzella Schlein, MD   1 year ago Encounter for Medicare annual wellness exam   [facility] [place]., MD

## 2024-01-26 DIAGNOSIS — Z950 Presence of cardiac pacemaker: Secondary | ICD-10-CM | POA: Diagnosis not present

## 2024-01-26 DIAGNOSIS — I38 Endocarditis, valve unspecified: Secondary | ICD-10-CM | POA: Diagnosis not present

## 2024-01-26 DIAGNOSIS — I495 Sick sinus syndrome: Secondary | ICD-10-CM | POA: Diagnosis not present

## 2024-01-26 DIAGNOSIS — I482 Chronic atrial fibrillation, unspecified: Secondary | ICD-10-CM | POA: Diagnosis not present

## 2024-01-26 DIAGNOSIS — I10 Essential (primary) hypertension: Secondary | ICD-10-CM | POA: Diagnosis not present

## 2024-02-09 DIAGNOSIS — I482 Chronic atrial fibrillation, unspecified: Secondary | ICD-10-CM | POA: Diagnosis not present

## 2024-02-09 DIAGNOSIS — Z01818 Encounter for other preprocedural examination: Secondary | ICD-10-CM | POA: Diagnosis not present

## 2024-02-21 DIAGNOSIS — H401131 Primary open-angle glaucoma, bilateral, mild stage: Secondary | ICD-10-CM | POA: Diagnosis not present

## 2024-02-28 DIAGNOSIS — H353122 Nonexudative age-related macular degeneration, left eye, intermediate dry stage: Secondary | ICD-10-CM | POA: Diagnosis not present

## 2024-02-28 DIAGNOSIS — H401131 Primary open-angle glaucoma, bilateral, mild stage: Secondary | ICD-10-CM | POA: Diagnosis not present

## 2024-02-28 DIAGNOSIS — H353211 Exudative age-related macular degeneration, right eye, with active choroidal neovascularization: Secondary | ICD-10-CM | POA: Diagnosis not present

## 2024-02-28 DIAGNOSIS — Z961 Presence of intraocular lens: Secondary | ICD-10-CM | POA: Diagnosis not present

## 2024-03-03 DIAGNOSIS — H353211 Exudative age-related macular degeneration, right eye, with active choroidal neovascularization: Secondary | ICD-10-CM | POA: Diagnosis not present

## 2024-03-03 DIAGNOSIS — H401131 Primary open-angle glaucoma, bilateral, mild stage: Secondary | ICD-10-CM | POA: Diagnosis not present

## 2024-03-03 DIAGNOSIS — H353122 Nonexudative age-related macular degeneration, left eye, intermediate dry stage: Secondary | ICD-10-CM | POA: Diagnosis not present

## 2024-03-03 DIAGNOSIS — Z961 Presence of intraocular lens: Secondary | ICD-10-CM | POA: Diagnosis not present

## 2024-03-07 ENCOUNTER — Encounter: Payer: Self-pay | Admitting: Cardiology

## 2024-03-07 ENCOUNTER — Ambulatory Visit
Admission: RE | Admit: 2024-03-07 | Discharge: 2024-03-07 | Disposition: A | Discharge status: Home / Self Care | Payer: Medicare Other | Attending: Cardiology | Admitting: Cardiology

## 2024-03-07 ENCOUNTER — Other Ambulatory Visit: Payer: Self-pay

## 2024-03-07 ENCOUNTER — Encounter
Admission: RE | Disposition: A | Discharge status: Home / Self Care | Payer: Self-pay | Source: Home / Self Care | Attending: Cardiology

## 2024-03-07 DIAGNOSIS — Z8249 Family history of ischemic heart disease and other diseases of the circulatory system: Secondary | ICD-10-CM | POA: Insufficient documentation

## 2024-03-07 DIAGNOSIS — I495 Sick sinus syndrome: Secondary | ICD-10-CM | POA: Insufficient documentation

## 2024-03-07 DIAGNOSIS — Z45010 Encounter for checking and testing of cardiac pacemaker pulse generator [battery]: Secondary | ICD-10-CM | POA: Diagnosis not present

## 2024-03-07 DIAGNOSIS — Z87891 Personal history of nicotine dependence: Secondary | ICD-10-CM | POA: Insufficient documentation

## 2024-03-07 DIAGNOSIS — I482 Chronic atrial fibrillation, unspecified: Secondary | ICD-10-CM | POA: Diagnosis not present

## 2024-03-07 SURGERY — PPM GENERATOR CHANGEOUT
Anesthesia: Moderate Sedation

## 2024-03-07 MED ORDER — CEFAZOLIN SODIUM-DEXTROSE 2-4 GM/100ML-% IV SOLN
2.0000 g | INTRAVENOUS | Status: AC
  Administered 2024-03-07: 2 g via INTRAVENOUS

## 2024-03-07 MED ORDER — CEPHALEXIN 750 MG PO CAPS: 750.0000 mg | ORAL_CAPSULE | Freq: Three times a day (TID) | ORAL | 0 refills | Status: DC

## 2024-03-07 MED ORDER — MIDAZOLAM HCL 2 MG/2ML IJ SOLN
INTRAMUSCULAR | Status: AC
  Filled 2024-03-07: qty 2

## 2024-03-07 MED ORDER — ACETAMINOPHEN 325 MG PO TABS: 325.0000 mg | ORAL_TABLET | ORAL | Status: DC | PRN

## 2024-03-07 MED ORDER — SODIUM CHLORIDE 0.9 % IV SOLN: INTRAVENOUS | Status: DC

## 2024-03-07 MED ORDER — MIDAZOLAM HCL 2 MG/2ML IJ SOLN
INTRAMUSCULAR | Status: DC | PRN
  Administered 2024-03-07: .5 mg via INTRAVENOUS

## 2024-03-07 MED ORDER — SODIUM CHLORIDE 0.9 % IV SOLN
80.0000 mg | INTRAVENOUS | Status: AC
  Administered 2024-03-07: 80 mg
  Filled 2024-03-07: qty 2

## 2024-03-07 MED ORDER — ONDANSETRON HCL 4 MG/2ML IJ SOLN: 4.0000 mg | Freq: Four times a day (QID) | INTRAMUSCULAR | Status: DC | PRN

## 2024-03-07 MED ORDER — FENTANYL CITRATE (PF) 100 MCG/2ML IJ SOLN
INTRAMUSCULAR | Status: DC | PRN
  Administered 2024-03-07: 12.5 ug via INTRAVENOUS

## 2024-03-07 MED ORDER — LIDOCAINE HCL 1 % IJ SOLN
INTRAMUSCULAR | Status: AC
  Filled 2024-03-07: qty 20

## 2024-03-07 MED ORDER — LIDOCAINE HCL 1 % IJ SOLN
INTRAMUSCULAR | Status: AC
  Filled 2024-03-07: qty 40

## 2024-03-07 MED ORDER — CEFAZOLIN SODIUM-DEXTROSE 2-4 GM/100ML-% IV SOLN
INTRAVENOUS | Status: AC
  Filled 2024-03-07: qty 100

## 2024-03-07 MED ORDER — FENTANYL CITRATE (PF) 100 MCG/2ML IJ SOLN
INTRAMUSCULAR | Status: AC
  Filled 2024-03-07: qty 2

## 2024-03-07 MED ORDER — LIDOCAINE HCL (PF) 1 % IJ SOLN
INTRAMUSCULAR | Status: DC | PRN
  Administered 2024-03-07: 60 mL

## 2024-03-07 SURGICAL SUPPLY — 13 items
CABLE SURG 12 DISP A/V CHANNEL (MISCELLANEOUS) IMPLANT
DEVICE DSSCT PLSMBLD 3.0S LGHT (MISCELLANEOUS) IMPLANT
DRAPE INCISE 23X17 STRL (DRAPES) IMPLANT
DRAPE INCISE IOBAN 23X17 STRL (DRAPES) ×1 IMPLANT
IPG PACE AZUR XT SR MRI W1SR01 (Pacemaker) IMPLANT
PACE AZURE XT SR MRI W1SR01 (Pacemaker) ×1 IMPLANT
PAD ELECT DEFIB RADIOL ZOLL (MISCELLANEOUS) IMPLANT
PLASMABLADE 3.0S W/LIGHT (MISCELLANEOUS) ×1 IMPLANT
POUCH AIGIS-R ANTIBACT PPM (Mesh General) ×1 IMPLANT
POUCH AIGIS-R ANTIBACT PPM MED (Mesh General) IMPLANT
SUT VIC AB 2-0 CT1 TAPERPNT 27 (SUTURE) IMPLANT
SUT VIC AB 4-0 PS2 18 (SUTURE) IMPLANT
TRAY PACEMAKER INSERTION (PACKS) ×1 IMPLANT

## 2024-03-07 NOTE — Consult Note (Signed)
 [facility] Cardiology  CARDIOLOGY CONSULT NOTE  Patient ID: Savannah Tucker MRN: [id number] DOB/AGE: 1930/04/22 89 y.o.  Admit date: 03/07/2024 Referring Physician [facility] Primary Physician [facility] Primary Cardiologist [place] Reason for Consultation sick sinus syndrome  HPI: 89 year old female, with history of chronic atrial fibrillation and sick sinus syndrome, status post single-chamber pacemaker 2014.  Recent pacemaker interrogation revealed elective replacement indication.  Patient currently asymptomatic.  Review of systems complete and found to be negative unless listed above     Past Medical History:  Diagnosis Date   Hyperlipidemia    Hypertension    Hypothyroid     Past Surgical History:  Procedure Laterality Date   APPENDECTOMY     BREAST BIOPSY Left 12/14/2000   EXCISIONAL - NEG   CATARACT EXTRACTION, BILATERAL     PACEMAKER INSERTION N/A    TOTAL HIP ARTHROPLASTY Right    TOTAL HIP ARTHROPLASTY Left     Medications Prior to Admission  Medication Sig Dispense Refill Last Dose/Taking   acetaminophen (TYLENOL) 500 MG tablet Take 1,000 mg by mouth every 6 (six) hours as needed for moderate pain.   Past Month   aspirin 325 MG tablet Take 325 mg by mouth daily after supper.   Past Week   bimatoprost (LUMIGAN) 0.01 % SOLN Place 1 drop into both eyes at bedtime.   03/06/2024   calcium carbonate (OS-CAL) 600 MG TABS tablet Take 600 mg by mouth in the morning and at bedtime.   03/06/2024   carvedilol (COREG) 12.5 MG tablet Take 12.5 mg by mouth 2 (two) times daily with a meal.   03/06/2024   cetirizine (ZYRTEC) 10 MG tablet TAKE 1 TABLET BY MOUTH DAILY. 30 tablet 11 03/06/2024   levothyroxine (SYNTHROID) 50 MCG tablet TAKE 1 TABLET EVERY DAY ON EMPTY STOMACHWITH A GLASS OF WATER AT LEAST 30-60 MINBEFORE BREAKFAST 90 tablet 0 03/07/2024 Morning   omeprazole (PRILOSEC) 20 MG capsule Take 1 capsule (20 mg total) by mouth every morning. 30 capsule 5 03/06/2024   OVER THE COUNTER  MEDICATION 20 mg daily. Collagen 2 scoops   03/06/2024   timolol (TIMOPTIC) 0.5 % ophthalmic solution Place 1 drop into both eyes in the morning.   03/06/2024   Social History   Socioeconomic History   Marital status: Widowed    Spouse name: Not on file   Number of children: 3   Years of education: Not on file   Highest education level: Some college, no degree  Occupational History   Not on file  Tobacco Use   Smoking status: Former   Smokeless tobacco: Never   Tobacco comments:    11/1998  Vaping Use   Vaping status: Never Used  Substance and Sexual Activity   Alcohol use: Yes    Alcohol/week: 7.0 - 14.0 standard drinks of alcohol    Types: 7 - 14 Glasses of wine per week   Drug use: No   Sexual activity: Not on file  Other Topics Concern   Not on file  Social History Narrative   Not on file   Social Drivers of Health   Financial Resource Strain: Low Risk  (06/28/2023)   Received from [facility]   Overall Financial Resource Strain (CARDIA)    Difficulty of Paying Living Expenses: Not hard at all  Food Insecurity: No Food Insecurity (07/16/2023)   Hunger Vital Sign    Worried About Running Out of Food in the Last Year: Never true    Ran Out of Food in  the Last Year: Never true  Transportation Needs: No Transportation Needs (07/16/2023)   PRAPARE - Administrator, Civil Service (Medical): No    Lack of Transportation (Non-Medical): No  Physical Activity: Inactive (06/20/2020)   Exercise Vital Sign    Days of Exercise per Week: 0 days    Minutes of Exercise per Session: 0 min  Stress: No Stress Concern Present (06/20/2020)   [employer] of Occupational Health - Occupational Stress Questionnaire    Feeling of Stress : Not at all  Social Connections: Socially Isolated (06/20/2020)   Social Connection and Isolation Panel [NHANES]    Frequency of Communication with Friends and Family: More than three times a week    Frequency of Social Gatherings with Friends  and Family: More than three times a week    Attends Religious Services: Never    Database administrator or Organizations: No    Attends Banker Meetings: Never    Marital Status: Widowed  Intimate Partner Violence: Not At Risk (06/20/2020)   Humiliation, Afraid, Rape, and Kick questionnaire    Fear of Current or Ex-Partner: No    Emotionally Abused: No    Physically Abused: No    Sexually Abused: No    Family History  Problem Relation Age of Onset   Asthma Mother    Hypertension Mother    Atrial fibrillation Mother    Osteoporosis Mother    Ulcers Mother    Diabetes Father    Hypertension Father    Heart disease Father    Heart disease Brother    Hypertension Brother    Diabetes Brother    Dementia Brother    Diabetes Son    Heart attack Son       Review of systems complete and found to be negative unless listed above      PHYSICAL EXAM  General: Well developed, well nourished, in no acute distress HEENT:  Normocephalic and atramatic Neck:  No JVD.  Lungs: Clear bilaterally to auscultation and percussion. Heart: HRRR . Normal S1 and S2 without gallops or murmurs.  Abdomen: Bowel sounds are positive, abdomen soft and non-tender  Msk:  Back normal, normal gait. Normal strength and tone for age. Extremities: No clubbing, cyanosis or edema.   Neuro: Alert and oriented X 3. Psych:  Good affect, responds appropriately  Labs:   Lab Results  Component Value Date   WBC 6.6 10/23/2021   HGB 13.5 10/23/2021   HCT 40.4 10/23/2021   MCV 98 (H) 10/23/2021   PLT 361 10/23/2021   No results for input(s): "NA", "K", "CL", "CO2", "BUN", "CREATININE", "CALCIUM", "PROT", "BILITOT", "ALKPHOS", "ALT", "AST", "GLUCOSE" in the last 168 hours.  Invalid input(s): "LABALBU" Lab Results  Component Value Date   TROPONINI 0.05 11/22/2013    Lab Results  Component Value Date   CHOL 196 11/26/2022   CHOL 215 (H) 05/03/2018   CHOL 192 11/28/2013   Lab Results   Component Value Date   HDL 82 11/26/2022   HDL 96 05/03/2018   HDL 57 11/28/2013   Lab Results  Component Value Date   LDLCALC 97 11/26/2022   LDLCALC 103 (H) 05/03/2018   LDLCALC 112 11/28/2013   Lab Results  Component Value Date   TRIG 94 11/26/2022   TRIG 78 05/03/2018   TRIG 116 11/28/2013   No results found for: "CHOLHDL" No results found for: "LDLDIRECT"    Radiology: No results found.  EKG: Atrial fibrillation with ventricular  pacing  ASSESSMENT AND PLAN:   1.  Sick sinus syndrome 2.  Chronic atrial fibrillation, not anticoagulation per patient's wishes 3.  Single-chamber pacemaker, at elective replacement indication  Recommendations  Single-chamber pacemaker generator change out  Signed: Marcina Millard MD,PhD, [facility] 03/07/2024, 9:21 AM

## 2024-03-07 NOTE — Discharge Instructions (Addendum)
 Patient may shower 03/09/2024.  Patient may remove outer dressing after shower.  Leave Steri-Strips on.

## 2024-03-20 DIAGNOSIS — Z950 Presence of cardiac pacemaker: Secondary | ICD-10-CM | POA: Diagnosis not present

## 2024-03-20 DIAGNOSIS — I495 Sick sinus syndrome: Secondary | ICD-10-CM | POA: Diagnosis not present

## 2024-03-20 DIAGNOSIS — I482 Chronic atrial fibrillation, unspecified: Secondary | ICD-10-CM | POA: Diagnosis not present

## 2024-03-20 DIAGNOSIS — Z45010 Encounter for checking and testing of cardiac pacemaker pulse generator [battery]: Secondary | ICD-10-CM | POA: Diagnosis not present

## 2024-03-20 DIAGNOSIS — I340 Nonrheumatic mitral (valve) insufficiency: Secondary | ICD-10-CM | POA: Diagnosis not present

## 2024-03-24 ENCOUNTER — Other Ambulatory Visit: Payer: Self-pay | Admitting: Family Medicine

## 2024-04-10 DIAGNOSIS — H353211 Exudative age-related macular degeneration, right eye, with active choroidal neovascularization: Secondary | ICD-10-CM | POA: Diagnosis not present

## 2024-05-11 ENCOUNTER — Encounter: Payer: Self-pay | Admitting: Family Medicine

## 2024-05-11 ENCOUNTER — Ambulatory Visit (INDEPENDENT_AMBULATORY_CARE_PROVIDER_SITE_OTHER): Payer: Self-pay | Admitting: Family Medicine

## 2024-05-11 VITALS — BP 138/76 | HR 85 | Ht 63.0 in | Wt 104.0 lb

## 2024-05-11 DIAGNOSIS — Z0000 Encounter for general adult medical examination without abnormal findings: Secondary | ICD-10-CM

## 2024-05-11 DIAGNOSIS — N3941 Urge incontinence: Secondary | ICD-10-CM | POA: Diagnosis not present

## 2024-05-11 DIAGNOSIS — I482 Chronic atrial fibrillation, unspecified: Secondary | ICD-10-CM

## 2024-05-11 DIAGNOSIS — E039 Hypothyroidism, unspecified: Secondary | ICD-10-CM | POA: Diagnosis not present

## 2024-05-11 DIAGNOSIS — I10 Essential (primary) hypertension: Secondary | ICD-10-CM

## 2024-05-11 DIAGNOSIS — M810 Age-related osteoporosis without current pathological fracture: Secondary | ICD-10-CM

## 2024-05-11 DIAGNOSIS — E785 Hyperlipidemia, unspecified: Secondary | ICD-10-CM

## 2024-05-11 DIAGNOSIS — Z0001 Encounter for general adult medical examination with abnormal findings: Secondary | ICD-10-CM

## 2024-05-11 DIAGNOSIS — M5416 Radiculopathy, lumbar region: Secondary | ICD-10-CM

## 2024-05-11 DIAGNOSIS — Z95811 Presence of heart assist device: Secondary | ICD-10-CM

## 2024-05-11 MED ORDER — OMEPRAZOLE 20 MG PO CPDR: 20.0000 mg | DELAYED_RELEASE_CAPSULE | Freq: Every day | ORAL | 5 refills | Status: AC | PRN

## 2024-05-11 NOTE — Progress Notes (Signed)
 Annual Wellness Visit     Patient: Savannah Tucker, Female    DOB: 1930-07-17, 89 y.o.   MRN: [id number] Visit Date: 05/11/2024  Today's Provider: Aden Agreste, MD   Chief Complaint  Patient presents with   Annual Exam    Last completed 05/06/23 Diet -  General, unhealthy mainly eating snacks Exercise - none Feeling - well but back aches Sleeping - fairly well with some nights having trouble staying a sleep Concerns -  left arm giving problems, feels it may be arthritis   Subjective    Savannah Tucker is a 89 y.o. female who presents today for her Annual Wellness Visit.  Discussed the use of AI scribe software for clinical note transcription with the patient, who gave verbal consent to proceed.  History of Present Illness   Savannah Tucker "Savannah Tucker" is a 89 year old female who presents with concerns about bladder control.  She experiences nocturnal bladder control issues, particularly at night and upon waking, leading to accidents before reaching the bathroom. She sometimes wakes up once during the night. There are no daytime bladder control issues.  She has significant back pain and sciatica, which she attributes to arthritis. Her back feels 'full of ice', and she experiences pain in her left arm that sometimes wakes her. Her leg is tight in the back and cool on the front, with occasional sensations of heat in her foot. She recalls receiving an injection in her back previously, which provided significant pain relief.  She is taking Preservision for macular degeneration, which has caused nausea and vomiting. She has had two injections in her eyes for this condition. She also takes Prilosec intermittently for gastrointestinal issues.  She has a pacemaker, which was recently replaced and protrudes more due to weight loss. She has received COVID-19 vaccinations and other routine vaccines but has declined the shingles vaccine.             Medications: Outpatient  Medications Prior to Visit  Medication Sig   acetaminophen  (TYLENOL ) 500 MG tablet Take 1,000 mg by mouth every 6 (six) hours as needed for moderate pain.   aspirin 325 MG tablet Take 325 mg by mouth daily after supper.   bimatoprost (LUMIGAN) 0.01 % SOLN Place 1 drop into both eyes at bedtime.   calcium carbonate (OS-CAL) 600 MG TABS tablet Take 600 mg by mouth in the morning and at bedtime.   carvedilol (COREG) 12.5 MG tablet Take 12.5 mg by mouth 2 (two) times daily with a meal.   cetirizine  (ZYRTEC ) 10 MG tablet TAKE 1 TABLET BY MOUTH DAILY.   levothyroxine  (SYNTHROID ) 50 MCG tablet TAKE 1 TABLET EVERY DAY ON EMPTY STOMACHWITH A GLASS OF WATER AT LEAST 30-60 MINBEFORE BREAKFAST   Multiple Vitamins-Minerals (PRESERVISION AREDS 2) CAPS Take by mouth.   OVER THE COUNTER MEDICATION 20 mg daily. Collagen 2 scoops   timolol (TIMOPTIC) 0.5 % ophthalmic solution Place 1 drop into both eyes in the morning.   [DISCONTINUED] cephALEXin  (KEFLEX ) 750 MG capsule Take 1 capsule (750 mg total) by mouth 3 (three) times daily. (Patient not taking: Reported on 05/11/2024)   [DISCONTINUED] omeprazole  (PRILOSEC) 20 MG capsule Take 1 capsule (20 mg total) by mouth every morning. (Patient not taking: Reported on 05/11/2024)   No facility-administered medications prior to visit.    No Known Allergies  Patient Care Team: Mazie Speed, MD as PCP - General (Family Medicine) Annell Kidney, MD as Referring Physician (Ophthalmology) Thomasene Flemings  J, MD as Consulting Physician (Cardiology) Harris Liming, MD as Referring Physician (Dermatology)  Review of Systems       Objective    Vitals: BP 138/76 (BP Location: Right Arm, Patient Position: Sitting, Cuff Size: Small)   Pulse 85   Ht 5\' 3"  (1.6 m)   Wt 104 lb (47.2 kg)   SpO2 100%   BMI 18.42 kg/m      Physical Exam Vitals reviewed.  Constitutional:      General: She is not in acute distress.    Appearance: Normal appearance.  She is well-developed. She is not diaphoretic.  HENT:     Head: Normocephalic and atraumatic.  Eyes:     General: No scleral icterus.    Conjunctiva/sclera: Conjunctivae normal.  Neck:     Thyroid : No thyromegaly.  Cardiovascular:     Rate and Rhythm: Normal rate and regular rhythm.     Heart sounds: Normal heart sounds. No murmur heard. Pulmonary:     Effort: Pulmonary effort is normal. No respiratory distress.     Breath sounds: Normal breath sounds. No wheezing, rhonchi or rales.  Musculoskeletal:     Cervical back: Neck supple.     Right lower leg: No edema.     Left lower leg: No edema.  Lymphadenopathy:     Cervical: No cervical adenopathy.  Skin:    General: Skin is warm and dry.     Findings: No rash.  Neurological:     Mental Status: She is alert and oriented to person, place, and time. Mental status is at baseline.  Psychiatric:        Mood and Affect: Mood normal.        Behavior: Behavior normal.     Most recent functional status assessment:    05/11/2024    1:40 PM  In your present state of health, do you have any difficulty performing the following activities:  Hearing? 1  Vision? 1  Difficulty concentrating or making decisions? 1  Walking or climbing stairs? 1  Dressing or bathing? 0  Doing errands, shopping? 1  Preparing Food and eating ? N  Using the Toilet? Y  In the past six months, have you accidently leaked urine? Y  Do you have problems with loss of bowel control? Y  Managing your Medications? N  Managing your Finances? N  Housekeeping or managing your Housekeeping? N   Most recent fall risk assessment:    05/11/2024    1:39 PM  Fall Risk   Falls in the past year? 1  Number falls in past yr: 1  Injury with Fall? 1  Risk for fall due to : History of fall(s)  Follow up Falls evaluation completed    Most recent depression screenings:    05/11/2024    1:39 PM 07/23/2023   11:31 AM  PHQ 2/9 Scores  PHQ - 2 Score 0 0  PHQ- 9 Score 7 10    Most recent cognitive screening:    05/11/2024    1:43 PM  6CIT Screen  What Year? 0 points  What month? 0 points  What time? 0 points  Count back from 20 0 points  Months in reverse 0 points  Repeat phrase 2 points  Total Score 2 points   Most recent Audit-C alcohol  use screening    05/11/2024    1:49 PM  Alcohol  Use Disorder Test (AUDIT)  1. How often do you have a drink containing alcohol ? 4  2. How many drinks  containing alcohol  do you have on a typical day when you are drinking? 0  3. How often do you have six or more drinks on one occasion? 0  AUDIT-C Score 4   A score of 3 or more in women, and 4 or more in men indicates increased risk for alcohol  abuse, EXCEPT if all of the points are from question 1   No results found.  No results found for any visits on 05/11/24.  Assessment & Plan     Annual wellness visit done today including the all of the following: Reviewed patient's Family Medical History Reviewed and updated list of patient's medical providers Assessment of cognitive impairment was done Assessed patient's functional ability Established a written schedule for health screening services Health Risk Assessent Completed and Reviewed  Exercise Activities and Dietary recommendations  Goals      DIET - INCREASE WATER INTAKE     Recommend increasing of water intake to 4 glasses a day.      Prevent falls     Recommend to remove any items from the home that may cause slips or trips.        Immunization History  Administered Date(s) Administered   Fluad Quad(high Dose 65+) 10/10/2020, 10/20/2022   Influenza, High Dose Seasonal PF 10/17/2019, 10/19/2023   Influenza-Unspecified 09/13/2013, 10/14/2018   PFIZER(Purple Top)SARS-COV-2 Vaccination 12/28/2019, 01/18/2020, 09/10/2020   Pfizer Covid-19 Vaccine Bivalent Booster 66yrs & up 04/13/2022   Pfizer(Comirnaty)Fall Seasonal Vaccine 12 years and older 09/24/2022, 08/30/2023   Pneumococcal Conjugate-13  10/15/2014   Pneumococcal Polysaccharide-23 09/14/2007, 10/17/2019   Td 07/30/2003   Tdap 11/12/2014    Health Maintenance  Topic Date Due   Zoster Vaccines- Shingrix (1 of 2) Never done   DEXA SCAN  01/07/2022   COVID-19 Vaccine (7 - Pfizer risk 2024-25 season) 02/27/2024   INFLUENZA VACCINE  07/14/2024   DTaP/Tdap/Td (3 - Td or Tdap) 11/12/2024   Medicare Annual Wellness (AWV)  05/11/2025   Pneumonia Vaccine 60+ Years old  Completed   HPV VACCINES  Aged Out   Meningococcal B Vaccine  Aged Out     Discussed health benefits of physical activity, and encouraged her to engage in regular exercise appropriate for her age and condition.    Problem List Items Addressed This Visit       Cardiovascular and Mediastinum   Chronic atrial fibrillation (HCC)   Benign essential HTN     Endocrine   Adult hypothyroidism   Relevant Orders   TSH     Musculoskeletal and Integument   Osteoporosis   Relevant Medications   omeprazole  (PRILOSEC) 20 MG capsule   Other Relevant Orders   VITAMIN D 25 Hydroxy (Vit-D Deficiency, Fractures)     Other   HLD (hyperlipidemia)   Relevant Orders   Comprehensive metabolic panel with GFR   Lipid panel   Presence of heart assist device (HCC)   Other Visit Diagnoses       Encounter for annual wellness visit (AWV) in Medicare patient    -  Primary     Lumbar radiculopathy       Relevant Medications   omeprazole  (PRILOSEC) 20 MG capsule   Other Relevant Orders   Ambulatory referral to Physical Medicine Rehab     Urge incontinence of urine               Back pain with sciatica Chronic back pain with sciatica, likely due to arthritis causing nerve irritation. Previous relief was achieved with ultrasound-guided  back injections. She expressed interest in pursuing back injections again for pain relief. - Refer to Dr. Erman Hayward at [facility] for evaluation and potential back injections.  Urinary incontinence Urinary incontinence primarily  occurs in the morning, likely due to weakened pelvic floor muscles. It is a quality of life issue with no evidence of nerve-related bladder issues as it is not persistent throughout the day. She declined pelvic floor physical therapy, which was discussed as a potential treatment option.  Macular degeneration She is receiving eye injections for macular degeneration. Preservision supplement is causing nausea and vomiting, and she is taking it as tolerated to avoid gastrointestinal side effects.  Hypertension Blood pressure was slightly elevated during the visit, likely due to stress from the visit itself. Blood pressure was rechecked and showed improvement.  Pacemaker replacement Recently replaced pacemaker device, which is more prominent due to weight loss. No issues with the device itself.  General Health Maintenance She is up to date on most vaccinations. Discussed the need for a tetanus booster in the fall and the option of an RSV vaccine. She declined shingles vaccine despite previous shingles episode. - Plan for tetanus booster in the fall. - Discuss RSV vaccine with pharmacy.          Return in about 1 year (around 05/11/2025) for AWV.     Aden Agreste, MD  [facility] [phone] (phone) [phone] (fax)  [facility]

## 2024-05-12 LAB — COMPREHENSIVE METABOLIC PANEL WITH GFR
ALT: 16 IU/L (ref 0–32)
AST: 25 IU/L (ref 0–40)
Albumin: 4.5 g/dL (ref 3.6–4.6)
Alkaline Phosphatase: 101 IU/L (ref 44–121)
BUN/Creatinine Ratio: 25 (ref 12–28)
BUN: 24 mg/dL (ref 10–36)
Bilirubin Total: 0.4 mg/dL (ref 0.0–1.2)
CO2: 23 mmol/L (ref 20–29)
Calcium: 10.6 mg/dL — ABNORMAL HIGH (ref 8.7–10.3)
Chloride: 93 mmol/L — ABNORMAL LOW (ref 96–106)
Creatinine, Ser: 0.97 mg/dL (ref 0.57–1.00)
Globulin, Total: 3.4 g/dL (ref 1.5–4.5)
Glucose: 100 mg/dL — ABNORMAL HIGH (ref 70–99)
Potassium: 4.7 mmol/L (ref 3.5–5.2)
Sodium: 134 mmol/L (ref 134–144)
Total Protein: 7.9 g/dL (ref 6.0–8.5)
eGFR: 55 mL/min/{1.73_m2} — ABNORMAL LOW (ref >59)

## 2024-05-12 LAB — VITAMIN D 25 HYDROXY (VIT D DEFICIENCY, FRACTURES): Vit D, 25-Hydroxy: 66.8 ng/mL (ref 30.0–100.0)

## 2024-05-12 LAB — LIPID PANEL
Chol/HDL Ratio: 2.3 ratio (ref 0.0–4.4)
Cholesterol, Total: 204 mg/dL — ABNORMAL HIGH (ref 100–199)
HDL: 88 mg/dL (ref >39)
LDL Chol Calc (NIH): 98 mg/dL (ref 0–99)
Triglycerides: 102 mg/dL (ref 0–149)
VLDL Cholesterol Cal: 18 mg/dL (ref 5–40)

## 2024-05-12 LAB — TSH: TSH: 2.49 u[IU]/mL (ref 0.450–4.500)

## 2024-05-16 ENCOUNTER — Telehealth: Payer: Self-pay | Admitting: Family Medicine

## 2024-05-16 NOTE — Telephone Encounter (Signed)
 Daughter in law came by and dropped off copy of living will. I have upload under demographics and also been sent off to HIM to be uploaded to file

## 2024-05-17 ENCOUNTER — Ambulatory Visit: Payer: Self-pay | Admitting: Family Medicine

## 2024-05-17 DIAGNOSIS — Z96643 Presence of artificial hip joint, bilateral: Secondary | ICD-10-CM | POA: Diagnosis not present

## 2024-05-17 DIAGNOSIS — M25552 Pain in left hip: Secondary | ICD-10-CM | POA: Diagnosis not present

## 2024-05-17 DIAGNOSIS — S6992XA Unspecified injury of left wrist, hand and finger(s), initial encounter: Secondary | ICD-10-CM | POA: Diagnosis not present

## 2024-05-17 DIAGNOSIS — S59912A Unspecified injury of left forearm, initial encounter: Secondary | ICD-10-CM | POA: Diagnosis not present

## 2024-05-17 DIAGNOSIS — Z7982 Long term (current) use of aspirin: Secondary | ICD-10-CM | POA: Diagnosis not present

## 2024-05-17 DIAGNOSIS — Z23 Encounter for immunization: Secondary | ICD-10-CM | POA: Diagnosis not present

## 2024-05-17 DIAGNOSIS — I10 Essential (primary) hypertension: Secondary | ICD-10-CM | POA: Diagnosis not present

## 2024-05-17 DIAGNOSIS — S61412A Laceration without foreign body of left hand, initial encounter: Secondary | ICD-10-CM | POA: Diagnosis not present

## 2024-05-17 DIAGNOSIS — S59902A Unspecified injury of left elbow, initial encounter: Secondary | ICD-10-CM | POA: Diagnosis not present

## 2024-05-17 DIAGNOSIS — S7002XA Contusion of left hip, initial encounter: Secondary | ICD-10-CM | POA: Diagnosis not present

## 2024-05-17 DIAGNOSIS — S51012A Laceration without foreign body of left elbow, initial encounter: Secondary | ICD-10-CM | POA: Diagnosis not present

## 2024-05-17 DIAGNOSIS — I495 Sick sinus syndrome: Secondary | ICD-10-CM | POA: Diagnosis not present

## 2024-05-17 DIAGNOSIS — S51812A Laceration without foreign body of left forearm, initial encounter: Secondary | ICD-10-CM | POA: Diagnosis not present

## 2024-05-17 DIAGNOSIS — R2681 Unsteadiness on feet: Secondary | ICD-10-CM | POA: Diagnosis not present

## 2024-05-17 DIAGNOSIS — Z043 Encounter for examination and observation following other accident: Secondary | ICD-10-CM | POA: Diagnosis not present

## 2024-05-19 ENCOUNTER — Ambulatory Visit: Admitting: Nurse Practitioner

## 2024-05-19 ENCOUNTER — Ambulatory Visit: Admission: EM | Admit: 2024-05-19 | Discharge: 2024-05-19 | Disposition: A | Discharge status: Home / Self Care

## 2024-05-19 ENCOUNTER — Ambulatory Visit: Admitting: Family Medicine

## 2024-05-19 ENCOUNTER — Encounter: Payer: Self-pay | Admitting: Family Medicine

## 2024-05-19 VITALS — BP 118/74 | HR 62 | Resp 16 | Ht 63.0 in | Wt 105.0 lb

## 2024-05-19 DIAGNOSIS — S41112D Laceration without foreign body of left upper arm, subsequent encounter: Secondary | ICD-10-CM | POA: Diagnosis not present

## 2024-05-19 DIAGNOSIS — T148XXA Other injury of unspecified body region, initial encounter: Secondary | ICD-10-CM | POA: Diagnosis not present

## 2024-05-19 DIAGNOSIS — S51012D Laceration without foreign body of left elbow, subsequent encounter: Secondary | ICD-10-CM

## 2024-05-19 DIAGNOSIS — Z09 Encounter for follow-up examination after completed treatment for conditions other than malignant neoplasm: Secondary | ICD-10-CM | POA: Diagnosis not present

## 2024-05-19 DIAGNOSIS — S51012A Laceration without foreign body of left elbow, initial encounter: Secondary | ICD-10-CM

## 2024-05-19 DIAGNOSIS — Z5189 Encounter for other specified aftercare: Secondary | ICD-10-CM | POA: Diagnosis not present

## 2024-05-19 DIAGNOSIS — S41112A Laceration without foreign body of left upper arm, initial encounter: Secondary | ICD-10-CM

## 2024-05-19 DIAGNOSIS — S41102A Unspecified open wound of left upper arm, initial encounter: Secondary | ICD-10-CM

## 2024-05-19 DIAGNOSIS — S41102D Unspecified open wound of left upper arm, subsequent encounter: Secondary | ICD-10-CM | POA: Diagnosis not present

## 2024-05-19 NOTE — Progress Notes (Signed)
 Patient ID: Savannah Tucker, female    DOB: 1930/08/26, 89 y.o.   MRN: [id number]  PCP: Mazie Speed, MD  Chief Complaint  Patient presents with   Rudolfo Cosier down stairs. Cut open L arm/hand. Went to ED 05/17/24 and received 24 stitches around the L elbow.    Subjective:   Savannah Tucker is a 89 y.o. female, presents to clinic with CC of the following:  HPI  Pt here for referrals? They actually don't know why they are here - they called PCP and then were scheduled with me for acute visit ED encounter 2 d ago, pt had fall, injury to left arm and hand skin tears, abrasions and multiple sutures  ED [facility] and ED recommended wound care [facility] and plastic surgeon Reviewed ED record, tried to look at pt mychart notes for photos of injury She is here with gauze and bandage from the ED large portions of it saturated through with dry blood and stuck to her, not removed since ED Her DIL is here with her is an nurse    Laceration repair comments: Comments:   The patient sustained multiple large skin avulsions to the left dorsal and medial forearm.  There was a complex subcutaneous laceration/avulsion of the tissues on the dorsal forearm that was repaired with (4) 4-0 simple interrupted Vicryl sutures.  The skin was approximated using (17) 4-0 simple interrupted Prolene sutures.  There was a additional 1.5 cm laceration to the left ulnar palm tha   She also had contusion to left hip, swelling starting and worsening in left elbow and to fingers She is on kelfex from the ED    Patient Active Problem List   Diagnosis Date Noted   Presence of heart assist device (HCC) 05/11/2024   Osteoporosis 05/11/2024   Dizziness 12/15/2018   Arthritis 11/06/2015   Alopecia 11/01/2015   Chronic atrial fibrillation (HCC) 11/01/2015   Decrease in the ability to hear 11/01/2015   Glaucoma 11/01/2015   HLD (hyperlipidemia) 11/01/2015   Adult hypothyroidism 11/01/2015    Osteopenia 11/01/2015   Artificial cardiac pacemaker 11/01/2015   Endocarditis 11/01/2015   Avitaminosis D 11/01/2015   Sick sinus syndrome (HCC) 12/31/2014   Benign essential HTN 12/31/2014      Current Outpatient Medications:    acetaminophen  (TYLENOL ) 500 MG tablet, Take 1,000 mg by mouth every 6 (six) hours as needed for moderate pain., Disp: , Rfl:    aspirin 325 MG tablet, Take 325 mg by mouth daily after supper., Disp: , Rfl:    bimatoprost (LUMIGAN) 0.01 % SOLN, Place 1 drop into both eyes at bedtime., Disp: , Rfl:    calcium carbonate (OS-CAL) 600 MG TABS tablet, Take 600 mg by mouth in the morning and at bedtime., Disp: , Rfl:    carvedilol (COREG) 12.5 MG tablet, Take 12.5 mg by mouth 2 (two) times daily with a meal., Disp: , Rfl:    cephALEXin  (KEFLEX ) 500 MG capsule, Take 500 mg by mouth 4 (four) times daily., Disp: , Rfl:    cetirizine  (ZYRTEC ) 10 MG tablet, TAKE 1 TABLET BY MOUTH DAILY., Disp: 30 tablet, Rfl: 11   levothyroxine  (SYNTHROID ) 50 MCG tablet, TAKE 1 TABLET EVERY DAY ON EMPTY STOMACHWITH A GLASS OF WATER AT LEAST 30-60 MINBEFORE BREAKFAST, Disp: 90 tablet, Rfl: 0   Multiple Vitamins-Minerals (PRESERVISION AREDS 2) CAPS, Take by mouth., Disp: , Rfl:    omeprazole  (PRILOSEC) 20 MG capsule, Take 1 capsule (20  mg total) by mouth daily as needed. Try 7-14 day course when symptoms worsen, Disp: 30 capsule, Rfl: 5   OVER THE COUNTER MEDICATION, 20 mg daily. Collagen 2 scoops, Disp: , Rfl:    timolol (TIMOPTIC) 0.5 % ophthalmic solution, Place 1 drop into both eyes in the morning., Disp: , Rfl:    No Known Allergies   Social History   Tobacco Use   Smoking status: Former   Smokeless tobacco: Never   Tobacco comments:    11/1998  Vaping Use   Vaping status: Never Used  Substance Use Topics   Alcohol  use: Yes    Alcohol /week: 7.0 - 14.0 standard drinks of alcohol     Types: 7 - 14 Glasses of wine per week   Drug use: No      Chart Review Today: I  personally reviewed active problem list, medication list, allergies, family history, social history, health maintenance, notes from last encounter, lab results, imaging with the patient/caregiver today.   Review of Systems  Constitutional: Negative.   HENT: Negative.    Eyes: Negative.   Respiratory: Negative.    Cardiovascular: Negative.   Gastrointestinal: Negative.   Endocrine: Negative.   Genitourinary: Negative.   Musculoskeletal: Negative.   Skin: Negative.   Allergic/Immunologic: Negative.   Neurological: Negative.   Hematological: Negative.   Psychiatric/Behavioral: Negative.    All other systems reviewed and are negative.      Objective:   Vitals:   05/19/24 1405  BP: 118/74  Pulse: 62  Resp: 16  SpO2: 98%  Weight: 105 lb (47.6 kg)  Height: 5\' 3"  (1.6 m)    Body mass index is 18.6 kg/m.  Physical Exam Vitals and nursing note reviewed.  Constitutional:      General: She is not in acute distress.    Appearance: She is not toxic-appearing or diaphoretic.  HENT:     Head: Normocephalic and atraumatic.     Right Ear: External ear normal.     Left Ear: External ear normal.  Eyes:     General:        Right eye: No discharge.        Left eye: No discharge.     Conjunctiva/sclera: Conjunctivae normal.  Pulmonary:     Effort: No respiratory distress.  Musculoskeletal:     Left elbow: Swelling present.     Left hand: Swelling present.  Skin:    Findings: Bruising and wound present.     Comments: Left elbow to left hand in bandage with swelling and brusing Left palm some visible sutures simple interrupted   Neurological:     Mental Status: She is alert.     Gait: Gait abnormal.  Psychiatric:        Mood and Affect: Mood normal.        Behavior: Behavior normal.     Removed ace wrap and gauze and bandaging underneath to left forearm and hand, stuck to pt in multiple areas with dry blood, no basins, or saline to irrigate or soak bandage off with, trying  to pull at edges of bandage was painful to pt In clinic we did not have any kerlex gauze or supplies to re-bandage - only xeroform and small nonadherent squares which would not adequately cover the large body surface that pt said was injured.  Daughter a nurse said she was too afraid to remove or replace bandage at home.             Assessment & Plan:  PT scheduled here today for acute visit, she is not our patient, but est upstairs with [facility] She believes this appt was scheduled when they asked for help getting referrals that ED said they ordered or that would need to come from PCP    ICD-10-CM   1. Laceration of left upper extremity, initial encounter  S41.112A Ambulatory referral to Plastic Surgery    Ambulatory referral to Home Health    2. Skin tear of left elbow without complication, initial encounter  S51.012A Ambulatory referral to Plastic Surgery    Ambulatory referral to Home Health    3. Open wound of left upper arm, initial encounter  S41.102A Ambulatory referral to Plastic Surgery    Ambulatory referral to Home Health    4. Encounter for examination following treatment at hospital  [phone]    ED encounter reviewed with pt today, multiple xrays reviewed and neg, complex laceration repair and many open wounds     Unfortunately pt fell Wed night and sustained what I understand to be extensive wounds, abrasions, skin tears and lacerations to left hand to elbow.   I cannot see images from [facility] ED record and pts DIL did not have images on her phone. She is here with original bandaging from ED The dressing is soaked through in multiple places Family member present is RN but was too afraid to attempt changing the gauze on her own. In clinic we reviewed records, discussed current needed referrals -  I first removed ace wrap to see the gauze and bandaging underneath covering left forearm and hand.  Before trying to remove any more I checked supplies in clinic.   The guaze was stuck  to pt in multiple areas with dry blood In primary care office we have no basins, and no saline to irrigate or soak bandage off with trying to pull at edges of bandage was painful to pt, so I did not proceed any more. In clinic we did not have any kerlex gauze or supplies to re-bandage - only xeroform and small nonadherant squares which would not adequately cover the large body surface that pt said was injured.  Daughter a nurse said she was too afraid to remove or replace bandage at home - I recommended going to the ED or UC with adequate supplies for wound recheck - they would likely have supplies needed to soak off the gauze - or they could attempt at home once they buy supplies.   I provided them non-adherent gauze and xeroform. I did the referral to plastic surgeon and [facility] for wound care - but its Friday afternoon and the bandage needs to be changed probably today.  She should continue the antibiotic.   Currently the swelling is expected and I did not see any signs of cellulitis - but I could not visualize any of the wounds except for a skin tear near her thumb on the edge of the gauze.       Adeline Hone, PA-C 05/19/24 2:29 PM

## 2024-05-19 NOTE — Discharge Instructions (Signed)
 Today you were evaluated for your wound  Has been redressed  Cleanse with unscented soap and water, pat and do not rub then cover with a nonadherent dressing and Coban, do once daily  Schedule follow-up appointment with primary doctor in 1 to 2 weeks for reevaluation and have them determine if plastic surgery follow-up is necessary because I do not believe that at this time

## 2024-05-19 NOTE — ED Triage Notes (Signed)
 Pt triaged by Provider

## 2024-05-19 NOTE — ED Provider Notes (Signed)
 Arlander Bellman    CSN: [id number] Arrival date & time: 05/19/24  1525      History   Chief Complaint No chief complaint on file.   HPI Savannah Tucker is a 89 y.o. female.   Patient presents for evaluation of dressing change to 20 6 sutures placed in the ED 3 days ago.  Wound was covered with nonadherent dressings and gauze but due to bleeding it has become adhered to the dressing.  Did follow-up with PCP today but office had no wound care supplies.  Past Medical History:  Diagnosis Date   Hyperlipidemia    Hypertension    Hypothyroid     Patient Active Problem List   Diagnosis Date Noted   Presence of heart assist device (HCC) 05/11/2024   Osteoporosis 05/11/2024   Dizziness 12/15/2018   Arthritis 11/06/2015   Alopecia 11/01/2015   Chronic atrial fibrillation (HCC) 11/01/2015   Decrease in the ability to hear 11/01/2015   Glaucoma 11/01/2015   HLD (hyperlipidemia) 11/01/2015   Adult hypothyroidism 11/01/2015   Osteopenia 11/01/2015   Artificial cardiac pacemaker 11/01/2015   Endocarditis 11/01/2015   Avitaminosis D 11/01/2015   Sick sinus syndrome (HCC) 12/31/2014   Benign essential HTN 12/31/2014    Past Surgical History:  Procedure Laterality Date   APPENDECTOMY     BREAST BIOPSY Left 12/14/2000   EXCISIONAL - NEG   CATARACT EXTRACTION, BILATERAL     PACEMAKER INSERTION N/A    PPM GENERATOR CHANGEOUT N/A 03/07/2024   Procedure: PPM GENERATOR CHANGEOUT;  Surgeon: Percival Brace, MD;  Location: [facility] INVASIVE CV LAB;  Service: Cardiovascular;  Laterality: N/A;   TOTAL HIP ARTHROPLASTY Right    TOTAL HIP ARTHROPLASTY Left     OB History   No obstetric history on file.      Home Medications    Prior to Admission medications   Medication Sig Start Date End Date Taking? Authorizing Provider  acetaminophen  (TYLENOL ) 500 MG tablet Take 1,000 mg by mouth every 6 (six) hours as needed for moderate pain.    [provider]  aspirin  325 MG tablet Take 325 mg by mouth daily after supper. 01/13/12   [provider]  bimatoprost (LUMIGAN) 0.01 % SOLN Place 1 drop into both eyes at bedtime.    [provider]  calcium carbonate (OS-CAL) 600 MG TABS tablet Take 600 mg by mouth in the morning and at bedtime. 12/28/12   [provider]  carvedilol (COREG) 12.5 MG tablet Take 12.5 mg by mouth 2 (two) times daily with a meal.    [provider]  cephALEXin  (KEFLEX ) 500 MG capsule Take 500 mg by mouth 4 (four) times daily.    [provider]  cetirizine  (ZYRTEC ) 10 MG tablet TAKE 1 TABLET BY MOUTH DAILY. 04/28/22   Nikki Barters, MD  levothyroxine  (SYNTHROID ) 50 MCG tablet TAKE 1 TABLET EVERY DAY ON EMPTY STOMACHWITH A GLASS OF WATER AT LEAST 30-60 MINBEFORE BREAKFAST 03/24/24   Bacigalupo, Stan Eans, MD  Multiple Vitamins-Minerals (PRESERVISION AREDS 2) CAPS Take by mouth.    [provider]  omeprazole  (PRILOSEC) 20 MG capsule Take 1 capsule (20 mg total) by mouth daily as needed. Try 7-14 day course when symptoms worsen 05/11/24   Bacigalupo, Angela M, MD  OVER THE COUNTER MEDICATION 20 mg daily. Collagen 2 scoops    [provider]  timolol (TIMOPTIC) 0.5 % ophthalmic solution Place 1 drop into both eyes in the morning. 01/13/12  [provider]    Family History Family History  Problem Relation Age of Onset   Asthma Mother    Hypertension Mother    Atrial fibrillation Mother    Osteoporosis Mother    Ulcers Mother    Diabetes Father    Hypertension Father    Heart disease Father    Heart disease Brother    Hypertension Brother    Diabetes Brother    Dementia Brother    Diabetes Son    Heart attack Son     Social History Social History   Tobacco Use   Smoking status: Former   Smokeless tobacco: Never   Tobacco comments:    11/1998  Vaping Use   Vaping status: Never Used  Substance Use Topics   Alcohol  use: Yes    Alcohol /week: 7.0 - 14.0  standard drinks of alcohol     Types: 7 - 14 Glasses of wine per week   Drug use: No     Allergies   Patient has no known allergies.   Review of Systems Review of Systems   Physical Exam Triage Vital Signs ED Triage Vitals [05/19/24 1631]  Encounter Vitals Group     BP 123/75     Systolic BP Percentile      Diastolic BP Percentile      Pulse Rate 81     Resp      Temp (!) 97.3 F (36.3 C)     Temp Source Temporal     SpO2 97 %     Weight      Height      Head Circumference      Peak Flow      Pain Score      Pain Loc      Pain Education      Exclude from Growth Chart    No data found.  Updated Vital Signs BP 123/75 (BP Location: Right Arm)   Pulse 81   Temp (!) 97.3 F (36.3 C) (Temporal)   SpO2 97%   Visual Acuity Right Eye Distance:   Left Eye Distance:   Bilateral Distance:    Right Eye Near:   Left Eye Near:    Bilateral Near:     Physical Exam Constitutional:      Appearance: Normal appearance.  Eyes:     Extraocular Movements: Extraocular movements intact.  Pulmonary:     Effort: Pulmonary effort is normal.  Skin:    Comments: Positive for laceration present to the left forearm and left hand, 20 6 sutures in place, bleeding subsided  Neurological:     Mental Status: She is alert and oriented to person, place, and time. Mental status is at baseline.      UC Treatments / Results  Labs (all labs ordered are listed, but only abnormal results are displayed) Labs Reviewed - No data to display  EKG   Radiology No results found.  Procedures Procedures (including critical care time)  Medications Ordered in UC Medications - No data to display  Initial Impression / Assessment and Plan / UC Course  I have reviewed the triage vital signs and the nursing notes.  Pertinent labs & imaging results that were available during my care of the patient were reviewed by me and considered in my medical decision making (see chart for  details).  Encounter for wound check skin avulsion  Soaked in saturated wound with chlorhexidine, saline and peroxide to get off bandage, further cleaned with chlorhexidine and covered  with nonadherent dressings and Coban, advised nonadherent dressings only to be used at home and advised to follow-up with PCP or the wound care center for further management   Final Clinical Impressions(s) / UC Diagnoses   Final diagnoses:  None   Discharge Instructions   None    ED Prescriptions   None    PDMP not reviewed this encounter.   Reena Canning, NP 05/19/24 1714

## 2024-05-19 NOTE — Patient Instructions (Addendum)
 Hibiclens plus warm water  Soak off current gauze, pat dry, apply xeroform (cut down to size to cover open wounds) cover with non-adherent gauze and then wrap with circular gauze and secure with acewrap.  I put in referrals to plastic surgery and home health.  Here was ER instructions Discharge Instructions - documented in this encounter  Discharge Instructions Luciana Ruth, MD - 05/17/2024 9:04 PM EDT  Formatting of this note might be different from the original. You have been seen in the Emergency Department (ED) today following a fall. Your exam was reassuring and your imaging showed no fractures or dislocations. You can expect, though, to be stiff and sore for the next several days. We repaired multiple large areas of skin tears to your left forearm. We also repaired a laceration to your left hand. These sutures will need to be removed to 7-10 days. Please keep the areas clean and dry. You may gently clean the areas with warm soapy water but do not submerge the areas in water.  We provided you with a tetanus booster and started you on a course of antibiotics. Please take these as directed and complete the entire course, even if you begin to feel better earlier.  We referred you to follow up with the [facility]. You will be contacted to schedule an appointment. If you do not hear from the clinic within the next 24 hours please call them at the phone number listed on this paperwork.  Please also follow up with your primary care doctor regarding today's ED visit and the injuries sustained in your fall. Please ensure they reassess the hematoma over your left hip.  For you pain you make Tylenol  500 mg by mouth every 4 hours, as needed.  Call your doctor or return to the ED if you develop a sudden or severe headache, confusion, slurred speech, facial droop, weakness or numbness in any arm or leg, extreme fatigue, vomiting more than two times, severe abdominal pain, new symptoms  that you did not originally have, or any other symptoms that concern you.  Thank you for trusting us  with your care. Electronically signed by Starlin Echevaria at 05/17/2024 9:04 PM EDT Electronically signed by Luciana Ruth, MD at 05/18/2024 12:05 AM EDT

## 2024-05-22 DIAGNOSIS — H353211 Exudative age-related macular degeneration, right eye, with active choroidal neovascularization: Secondary | ICD-10-CM | POA: Diagnosis not present

## 2024-05-26 ENCOUNTER — Ambulatory Visit
Admission: RE | Admit: 2024-05-26 | Discharge: 2024-05-26 | Disposition: A | Discharge status: Home / Self Care | Source: Ambulatory Visit | Attending: Emergency Medicine | Admitting: Emergency Medicine

## 2024-05-26 VITALS — BP 106/58 | HR 66 | Temp 97.5°F | Resp 18

## 2024-05-26 DIAGNOSIS — L089 Local infection of the skin and subcutaneous tissue, unspecified: Secondary | ICD-10-CM | POA: Diagnosis not present

## 2024-05-26 DIAGNOSIS — Z4802 Encounter for removal of sutures: Secondary | ICD-10-CM | POA: Diagnosis not present

## 2024-05-26 DIAGNOSIS — T148XXA Other injury of unspecified body region, initial encounter: Secondary | ICD-10-CM | POA: Diagnosis not present

## 2024-05-26 DIAGNOSIS — S7002XD Contusion of left hip, subsequent encounter: Secondary | ICD-10-CM | POA: Diagnosis not present

## 2024-05-26 MED ORDER — DOXYCYCLINE HYCLATE 100 MG PO CAPS: 100.0000 mg | ORAL_CAPSULE | Freq: Two times a day (BID) | ORAL | 0 refills | Status: AC

## 2024-05-26 NOTE — ED Provider Notes (Signed)
 Savannah Tucker    CSN: [id number] Arrival date & time: 05/26/24  1334      History   Chief Complaint Chief Complaint  Patient presents with   Suture / Staple Removal    HPI Savannah Tucker is a 89 y.o. female.  Accompanied by her daughter-in-law, patient presents for a wound check and suture removal.  She fell while coming out of the hairdresser's on 05/17/2024.  She has been cleaning her wounds and changing her dressings every other day.  She denies fever or chills.  Patient also presents with ongoing hematoma of her left hip and would like this rechecked today.   Patient was seen at [facility] ED on 05/17/2024; diagnosed with skin avulsion, fall, laceration of left hand, hematoma of left hip; sutures placed at that time; tetanus updated and patient discharged on cephalexin .  She was seen at this urgent care on 05/19/2024 for recheck of her wounds and wound care.  The history is provided by the patient, a relative and medical records.    Past Medical History:  Diagnosis Date   Hyperlipidemia    Hypertension    Hypothyroid     Patient Active Problem List   Diagnosis Date Noted   Presence of heart assist device (HCC) 05/11/2024   Osteoporosis 05/11/2024   Dizziness 12/15/2018   Arthritis 11/06/2015   Alopecia 11/01/2015   Chronic atrial fibrillation (HCC) 11/01/2015   Decrease in the ability to hear 11/01/2015   Glaucoma 11/01/2015   HLD (hyperlipidemia) 11/01/2015   Adult hypothyroidism 11/01/2015   Osteopenia 11/01/2015   Artificial cardiac pacemaker 11/01/2015   Endocarditis 11/01/2015   Avitaminosis D 11/01/2015   Sick sinus syndrome (HCC) 12/31/2014   Benign essential HTN 12/31/2014    Past Surgical History:  Procedure Laterality Date   APPENDECTOMY     BREAST BIOPSY Left 12/14/2000   EXCISIONAL - NEG   CATARACT EXTRACTION, BILATERAL     PACEMAKER INSERTION N/A    PPM GENERATOR CHANGEOUT N/A 03/07/2024   Procedure: PPM GENERATOR CHANGEOUT;   Surgeon: Percival Brace, MD;  Location: [facility] INVASIVE CV LAB;  Service: Cardiovascular;  Laterality: N/A;   TOTAL HIP ARTHROPLASTY Right    TOTAL HIP ARTHROPLASTY Left     OB History   No obstetric history on file.      Home Medications    Prior to Admission medications   Medication Sig Start Date End Date Taking? Authorizing Provider  doxycycline  (VIBRAMYCIN ) 100 MG capsule Take 1 capsule (100 mg total) by mouth 2 (two) times daily for 7 days. 05/26/24 06/02/24 Yes Wellington Half, NP  acetaminophen  (TYLENOL ) 500 MG tablet Take 1,000 mg by mouth every 6 (six) hours as needed for moderate pain.    [provider]  aspirin 325 MG tablet Take 325 mg by mouth daily after supper. 01/13/12   [provider]  bimatoprost (LUMIGAN) 0.01 % SOLN Place 1 drop into both eyes at bedtime.    [provider]  calcium carbonate (OS-CAL) 600 MG TABS tablet Take 600 mg by mouth in the morning and at bedtime. 12/28/12   [provider]  carvedilol (COREG) 12.5 MG tablet Take 12.5 mg by mouth 2 (two) times daily with a meal.    [provider]  cetirizine  (ZYRTEC ) 10 MG tablet TAKE 1 TABLET BY MOUTH DAILY. 04/28/22   Nikki Barters, MD  levothyroxine  (SYNTHROID ) 50 MCG tablet TAKE 1 TABLET EVERY DAY ON EMPTY STOMACHWITH A GLASS OF WATER AT LEAST  30-60 MINBEFORE BREAKFAST 03/24/24   Bacigalupo, Stan Eans, MD  Multiple Vitamins-Minerals (PRESERVISION AREDS 2) CAPS Take by mouth.    [provider]  omeprazole  (PRILOSEC) 20 MG capsule Take 1 capsule (20 mg total) by mouth daily as needed. Try 7-14 day course when symptoms worsen 05/11/24   Bacigalupo, Angela M, MD  OVER THE COUNTER MEDICATION 20 mg daily. Collagen 2 scoops    [provider]  timolol (TIMOPTIC) 0.5 % ophthalmic solution Place 1 drop into both eyes in the morning. 01/13/12   [provider]    Family History Family History  Problem Relation Age of Onset   Asthma Mother     Hypertension Mother    Atrial fibrillation Mother    Osteoporosis Mother    Ulcers Mother    Diabetes Father    Hypertension Father    Heart disease Father    Heart disease Brother    Hypertension Brother    Diabetes Brother    Dementia Brother    Diabetes Son    Heart attack Son     Social History Social History   Tobacco Use   Smoking status: Former   Smokeless tobacco: Never   Tobacco comments:    11/1998  Vaping Use   Vaping status: Never Used  Substance Use Topics   Alcohol  use: Yes    Alcohol /week: 7.0 - 14.0 standard drinks of alcohol     Types: 7 - 14 Glasses of wine per week   Drug use: No     Allergies   Patient has no known allergies.   Review of Systems Review of Systems  Constitutional:  Negative for chills and fever.  Musculoskeletal:  Negative for gait problem and joint swelling.  Skin:  Positive for color change and wound.  Neurological:  Negative for syncope, weakness and numbness.     Physical Exam Triage Vital Signs ED Triage Vitals  Encounter Vitals Group     BP 05/26/24 1358 (!) 106/58     Girls Systolic BP Percentile --      Girls Diastolic BP Percentile --      Boys Systolic BP Percentile --      Boys Diastolic BP Percentile --      Pulse Rate 05/26/24 1358 66     Resp 05/26/24 1358 18     Temp 05/26/24 1358 (!) 97.5 F (36.4 C)     Temp Source 05/26/24 1358 Oral     SpO2 05/26/24 1356 97 %     Weight --      Height --      Head Circumference --      Peak Flow --      Pain Score 05/26/24 1356 0     Pain Loc --      Pain Education --      Exclude from Growth Chart --    No data found.  Updated Vital Signs BP (!) 106/58 (BP Location: Right Arm)   Pulse 66   Temp (!) 97.5 F (36.4 C) (Oral)   Resp 18   SpO2 97%   Visual Acuity Right Eye Distance:   Left Eye Distance:   Bilateral Distance:    Right Eye Near:   Left Eye Near:    Bilateral Near:     Physical Exam Constitutional:      General: She is not in  acute distress. HENT:     Mouth/Throat:     Mouth: Mucous membranes are moist.   Cardiovascular:  Rate and Rhythm: Normal rate and regular rhythm.  Pulmonary:     Effort: Pulmonary effort is normal. No respiratory distress.   Musculoskeletal:        General: Tenderness present. No deformity. Normal range of motion.     Comments: Large hematoma and ecchymosis on left hip.   Skin:    General: Skin is warm and dry.     Findings: Bruising and lesion present.     Comments: Multiple lacerations on left forearm and hand.  1 laceration on her left thumb has a small amount of purulent drainage.  Patient has bruising around her lacerations.   Neurological:     General: No focal deficit present.     Mental Status: She is alert.     Sensory: No sensory deficit.     Motor: No weakness.     Gait: Gait normal.      UC Treatments / Results  Labs (all labs ordered are listed, but only abnormal results are displayed) Labs Reviewed - No data to display  EKG   Radiology No results found.  Procedures Procedures (including critical care time)  Medications Ordered in UC Medications - No data to display  Initial Impression / Assessment and Plan / UC Course  I have reviewed the triage vital signs and the nursing notes.  Pertinent labs & imaging results that were available during my care of the patient were reviewed by me and considered in my medical decision making (see chart for details).    Infected wound (left thumb), visit for suture removal, hematoma on left hip.  Afebrile and vital signs are stable.  Sutures removed by RN.  Instructed patient to stop the cephalexin .  Starting doxycycline  today.  Tetanus is up-to-date.  Wound care instructions and signs of worsening infection discussed.  Education provided on wound infection and hematoma.  Instructed patient to follow-up with her PCP on Monday.  ED precautions discussed.  Patient and her daughter-in-law agree to plan of  care.  Final Clinical Impressions(s) / UC Diagnoses   Final diagnoses:  Infected wound  Visit for suture removal  Hematoma of left hip, subsequent encounter     Discharge Instructions      Stop taking the cephalexin  and start taking the doxycycline .    Clean your wounds twice a day with soap and water.  Then apply an antibiotic ointment and nonadherent dressing.    Follow-up with your primary care provider on Monday.  Go to the emergency department if you have worsening symptoms.     ED Prescriptions     Medication Sig Dispense Auth. Provider   doxycycline  (VIBRAMYCIN ) 100 MG capsule Take 1 capsule (100 mg total) by mouth 2 (two) times daily for 7 days. 14 capsule Wellington Half, NP      PDMP not reviewed this encounter.   Wellington Half, NP 05/26/24 [phone]

## 2024-05-26 NOTE — ED Triage Notes (Signed)
 Patient present on day 9 to have sutures remove. Patient denies Pain.

## 2024-05-26 NOTE — Discharge Instructions (Addendum)
 Stop taking the cephalexin  and start taking the doxycycline .    Clean your wounds twice a day with soap and water.  Then apply an antibiotic ointment and nonadherent dressing.    Follow-up with your primary care provider on Monday.  Go to the emergency department if you have worsening symptoms.

## 2024-05-30 DIAGNOSIS — M48062 Spinal stenosis, lumbar region with neurogenic claudication: Secondary | ICD-10-CM | POA: Diagnosis not present

## 2024-05-30 DIAGNOSIS — M47816 Spondylosis without myelopathy or radiculopathy, lumbar region: Secondary | ICD-10-CM | POA: Diagnosis not present

## 2024-05-30 DIAGNOSIS — M5416 Radiculopathy, lumbar region: Secondary | ICD-10-CM | POA: Diagnosis not present

## 2024-06-05 ENCOUNTER — Ambulatory Visit: Admitting: Family Medicine

## 2024-06-05 ENCOUNTER — Encounter: Payer: Self-pay | Admitting: Family Medicine

## 2024-06-05 VITALS — BP 126/76 | HR 76 | Ht 63.0 in | Wt 102.0 lb

## 2024-06-05 DIAGNOSIS — W19XXXA Unspecified fall, initial encounter: Secondary | ICD-10-CM | POA: Insufficient documentation

## 2024-06-05 DIAGNOSIS — W19XXXD Unspecified fall, subsequent encounter: Secondary | ICD-10-CM | POA: Diagnosis not present

## 2024-06-05 DIAGNOSIS — T148XXA Other injury of unspecified body region, initial encounter: Secondary | ICD-10-CM | POA: Insufficient documentation

## 2024-06-05 DIAGNOSIS — S79912D Unspecified injury of left hip, subsequent encounter: Secondary | ICD-10-CM

## 2024-06-05 NOTE — Assessment & Plan Note (Addendum)
 Patient had a fall last year in the shower and was evaluated by physical therapy. Following their evaluations, she had railings places in the house for stability. Since then, she has most recently had a fall on 05/17/24. She did not lose consciousness or hit her head. She did sustain bruising and lacerations requiring suture which were done at [facility]. They did want her to follow up with plastic surgery for her skin flap wound closure but they plan to see a plastics group in [place]. We discussed importance of strengthening and avoiding trip hazards to prevent falls. She does not wish to have any physical therapy at this time. We did change her dressing today. -Follow up with plastic surgery to evaluate wound -Follow up for physical

## 2024-06-05 NOTE — Assessment & Plan Note (Signed)
 Patient was found to have hypercalcemia on her most recent CMP. She is asymptomatic from that standpoint but has been taking calcium supplementation twice daily for years now.  -Recheck CMP to assess for reduction vs. cessation of calcium supplement

## 2024-06-05 NOTE — Progress Notes (Unsigned)
 Established Patient Office Visit  Subjective   Patient ID: Savannah Tucker, female    DOB: [birth year]  Age: 89 y.o. MRN: [id number]  Chief Complaint  Patient presents with   Hospitalization Follow-up    She had a fall back on June 4th she was seen and treated at the ED. She hurt her L arm hand, the lower back and hip she had to get stitches, they where removed on 6/13. She is feeling better no concerns    Savannah Tucker is a 89 y/o female presenting today for hospital follow up following a fall on 05/17/2024. She has a history of falls as of last year. She was seen at [facility] for evaluation on 6/4. She has plain films taken there which revealed no upper or lower extremity fractures. She did sustain a large hematoma on her left hip region as well as multiple lacerations and abrasions on her left arm. She has sutures placed in the ER to fix her lacerations including a skin flap reattached to cover the open wound created in its absence. She has since been to the [facility] urgent care for suture removal and dressing changes. Her daughter-in-law is a Engineer, civil (consulting) and has been helping with dressing changes at home.   Overall the patient is in good spirits and feels well today save a bit of soreness in the areas of her injury. She has not felt any lightheadedness or palpitations and has not fallen again since. She denies any fevers or chills or any excessive pain in her left hip or arm.  Past Medical History:  Diagnosis Date   Hyperlipidemia    Hypertension    Hypothyroid    Past Surgical History:  Procedure Laterality Date   APPENDECTOMY     BREAST BIOPSY Left 12/14/2000   EXCISIONAL - NEG   CATARACT EXTRACTION, BILATERAL     PACEMAKER INSERTION N/A    PPM GENERATOR CHANGEOUT N/A 03/07/2024   Procedure: PPM GENERATOR CHANGEOUT;  Surgeon: Ammon Blunt, MD;  Location: [facility] INVASIVE CV LAB;  Service: Cardiovascular;  Laterality: N/A;   TOTAL HIP ARTHROPLASTY Right    TOTAL HIP  ARTHROPLASTY Left      Objective:     BP 126/76   Pulse 76   Ht 5' 3 (1.6 m)   Wt 102 lb (46.3 kg)   SpO2 99%   BMI 18.07 kg/m  BP Readings from Last 3 Encounters:  06/05/24 126/76  05/26/24 (!) 106/58  05/19/24 123/75   Wt Readings from Last 3 Encounters:  06/05/24 102 lb (46.3 kg)  05/19/24 105 lb (47.6 kg)  05/11/24 104 lb (47.2 kg)      Physical Exam Constitutional:      General: She is not in acute distress.    Appearance: Normal appearance. She is normal weight.  HENT:     Head: Normocephalic and atraumatic.     Right Ear: External ear normal.     Left Ear: External ear normal.     Nose: Nose normal.     Mouth/Throat:     Mouth: Mucous membranes are moist.     Pharynx: Oropharynx is clear. No oropharyngeal exudate.   Eyes:     General: No scleral icterus.    Extraocular Movements: Extraocular movements intact.     Conjunctiva/sclera: Conjunctivae normal.     Pupils: Pupils are equal, round, and reactive to light.    Cardiovascular:     Rate and Rhythm: Normal rate and regular rhythm.  Pulses: Normal pulses.     Heart sounds: Normal heart sounds. No murmur heard.    No friction rub. No gallop.  Pulmonary:     Effort: Pulmonary effort is normal. No respiratory distress.     Breath sounds: Normal breath sounds. No wheezing or rales.  Abdominal:     General: Abdomen is flat. There is no distension.   Musculoskeletal:        General: Swelling, tenderness and signs of injury present. Normal range of motion.     Cervical back: Normal range of motion.     Right lower leg: No edema.     Left lower leg: No edema.     Comments: Patient has baseball sized hematoma around the posterior aspect of the iliac wing  with ecchymosis that has migrated down across the greater trochanter and anteriorly to the superior thigh.   Skin:    General: Skin is warm and dry.     Coloration: Skin is not jaundiced or pale.     Findings: Bruising and lesion present. No  erythema or rash.     Comments: Patient has well healing abrasions and lacerations of her left arm. She does also have an area where her skin flap was sewn back that does appear to have the superficial layer necrotic along the posterior left forearm.. She has sensation in the region. No purulence or erythema surrounding any arm lesions.   Neurological:     General: No focal deficit present.     Mental Status: She is alert and oriented to person, place, and time. Mental status is at baseline.     Cranial Nerves: No cranial nerve deficit.     Motor: No weakness.     Gait: Gait abnormal.     Comments: Slow and cautious gait. Ambulates with 4-point cane.  Psychiatric:        Mood and Affect: Mood normal.        Behavior: Behavior normal.        Thought Content: Thought content normal.      No results found for any visits on 06/05/24.  Last metabolic panel Lab Results  Component Value Date   GLUCOSE 100 (H) 05/11/2024   NA 134 05/11/2024   K 4.7 05/11/2024   CL 93 (L) 05/11/2024   CO2 23 05/11/2024   BUN 24 05/11/2024   CREATININE 0.97 05/11/2024   EGFR 55 (L) 05/11/2024   CALCIUM 10.6 (H) 05/11/2024   PROT 7.9 05/11/2024   ALBUMIN 4.5 05/11/2024   LABGLOB 3.4 05/11/2024   AGRATIO 1.5 11/26/2022   BILITOT 0.4 05/11/2024   ALKPHOS 101 05/11/2024   AST 25 05/11/2024   ALT 16 05/11/2024   ANIONGAP 4 (L) 11/20/2013   Last lipids Lab Results  Component Value Date   CHOL 204 (H) 05/11/2024   HDL 88 05/11/2024   LDLCALC 98 05/11/2024   TRIG 102 05/11/2024   CHOLHDL 2.3 05/11/2024   Last thyroid  functions Lab Results  Component Value Date   TSH 2.490 05/11/2024   Last vitamin D  Lab Results  Component Value Date   VD25OH 66.8 05/11/2024      The ASCVD Risk score (Arnett DK, et al., 2019) failed to calculate for the following reasons:   The 2019 ASCVD risk score is only valid for ages 33 to 39    Assessment & Plan:   Problem List Items Addressed This Visit        Other   Fall - Primary  Patient had a fall last year in the shower and was evaluated by physical therapy. Following their evaluations, she had railings places in the house for stability. Since then, she has most recently had a fall on 05/17/24. She did not lose consciousness or hit her head. She did sustain bruising and lacerations requiring suture which were done at [facility]. They did want her to follow up with plastic surgery for her skin flap wound closure but they plan to see a plastics group in [place]. We discussed importance of strengthening and avoiding trip hazards to prevent falls. She does not wish to have any physical therapy at this time. We did change her dressing today. -Follow up with plastic surgery to evaluate wound -Follow up for physical       Hematoma   Secondary to her fall, patient has developed a baseball sized hematoma around the posterior aspect of the iliac wing  with ecchymosis that has migrated down across the greater trochanter and anteriorly to the superior thigh. It is tender to palpation but its coloration is consistent with it improving. Patient informed that this will take at least a few months to fully resorb. -Continue to monitor for resolution      Hypercalcemia   Patient was found to have hypercalcemia on her most recent CMP. She is asymptomatic from that standpoint but has been taking calcium supplementation twice daily for years now.  -Recheck CMP to assess for reduction vs. cessation of calcium supplement      Relevant Orders   Calcium    Return if symptoms worsen or fail to improve.    Leonor JAYSON Clause, Medical Student   Patient seen along with MS3 student, Spurgeon Clause. I personally evaluated this patient along with the student, and verified all aspects of the history, physical exam, and medical decision making as documented by the student. I agree with the student's documentation and have made all necessary  edits.  Bacigalupo, Jon HERO, MD, MPH [facility]

## 2024-06-05 NOTE — Assessment & Plan Note (Signed)
 Secondary to her fall, patient has developed a baseball sized hematoma around the posterior aspect of the iliac wing  with ecchymosis that has migrated down across the greater trochanter and anteriorly to the superior thigh. It is tender to palpation but its coloration is consistent with it improving. Patient informed that this will take at least a few months to fully resorb. -Continue to monitor for resolution

## 2024-06-06 ENCOUNTER — Ambulatory Visit: Payer: Self-pay | Admitting: Family Medicine

## 2024-06-06 LAB — CALCIUM: Calcium: 9.8 mg/dL (ref 8.7–10.3)

## 2024-06-12 ENCOUNTER — Ambulatory Visit: Admitting: Family Medicine

## 2024-06-12 ENCOUNTER — Ambulatory Visit (INDEPENDENT_AMBULATORY_CARE_PROVIDER_SITE_OTHER): Admitting: Plastic Surgery

## 2024-06-12 VITALS — BP 133/76 | HR 80 | Ht 63.0 in | Wt 104.2 lb

## 2024-06-12 DIAGNOSIS — S41112D Laceration without foreign body of left upper arm, subsequent encounter: Secondary | ICD-10-CM

## 2024-06-12 DIAGNOSIS — W19XXXD Unspecified fall, subsequent encounter: Secondary | ICD-10-CM

## 2024-06-12 DIAGNOSIS — T148XXA Other injury of unspecified body region, initial encounter: Secondary | ICD-10-CM

## 2024-06-12 DIAGNOSIS — S41119A Laceration without foreign body of unspecified upper arm, initial encounter: Secondary | ICD-10-CM | POA: Insufficient documentation

## 2024-06-12 DIAGNOSIS — S41112A Laceration without foreign body of left upper arm, initial encounter: Secondary | ICD-10-CM

## 2024-06-12 NOTE — Progress Notes (Signed)
 Patient ID: Savannah Tucker, female    DOB: 04-05-1930, 89 y.o.   MRN: [id number]   Chief Complaint  Patient presents with   Advice Only    The patient is a 89 year old female here for evaluation of her left arm.  She fell and sustained a laceration about 3 weeks ago.  She was seen in the ER and told to keep it wrapped.  She has been doing a great job keeping it clean.  It does not appear to be infected and it appears to be healing nicely.  The area is about 2 x 8 cm on the ulnar aspect of the left arm.    Review of Systems  Constitutional: Negative.   HENT: Negative.    Eyes: Negative.   Respiratory: Negative.    Cardiovascular: Negative.   Gastrointestinal: Negative.   Genitourinary: Negative.   Musculoskeletal: Negative.     Past Medical History:  Diagnosis Date   Hyperlipidemia    Hypertension    Hypothyroid     Past Surgical History:  Procedure Laterality Date   APPENDECTOMY     BREAST BIOPSY Left 12/14/2000   EXCISIONAL - NEG   CATARACT EXTRACTION, BILATERAL     PACEMAKER INSERTION N/A    PPM GENERATOR CHANGEOUT N/A 03/07/2024   Procedure: PPM GENERATOR CHANGEOUT;  Surgeon: Ammon Blunt, MD;  Location: [facility] INVASIVE CV LAB;  Service: Cardiovascular;  Laterality: N/A;   TOTAL HIP ARTHROPLASTY Right    TOTAL HIP ARTHROPLASTY Left       Current Outpatient Medications:    acetaminophen  (TYLENOL ) 500 MG tablet, Take 1,000 mg by mouth every 6 (six) hours as needed for moderate pain., Disp: , Rfl:    aspirin 325 MG tablet, Take 325 mg by mouth daily after supper., Disp: , Rfl:    bimatoprost (LUMIGAN) 0.01 % SOLN, Place 1 drop into both eyes at bedtime., Disp: , Rfl:    calcium carbonate (OS-CAL) 600 MG TABS tablet, Take 600 mg by mouth in the morning and at bedtime., Disp: , Rfl:    carvedilol (COREG) 12.5 MG tablet, Take 12.5 mg by mouth 2 (two) times daily with a meal., Disp: , Rfl:    cetirizine  (ZYRTEC ) 10 MG tablet, TAKE 1 TABLET BY MOUTH DAILY.,  Disp: 30 tablet, Rfl: 11   levothyroxine  (SYNTHROID ) 50 MCG tablet, TAKE 1 TABLET EVERY DAY ON EMPTY STOMACHWITH A GLASS OF WATER AT LEAST 30-60 MINBEFORE BREAKFAST, Disp: 90 tablet, Rfl: 0   Multiple Vitamins-Minerals (PRESERVISION AREDS 2) CAPS, Take by mouth., Disp: , Rfl:    omeprazole  (PRILOSEC) 20 MG capsule, Take 1 capsule (20 mg total) by mouth daily as needed. Try 7-14 day course when symptoms worsen, Disp: 30 capsule, Rfl: 5   OVER THE COUNTER MEDICATION, 20 mg daily. Collagen 2 scoops, Disp: , Rfl:    timolol (TIMOPTIC) 0.5 % ophthalmic solution, Place 1 drop into both eyes in the morning., Disp: , Rfl:    Objective:   Vitals:   06/12/24 1241  BP: 133/76  Pulse: 80  SpO2: 97%    Physical Exam HENT:     Head: Normocephalic.   Cardiovascular:     Pulses: Normal pulses.  Pulmonary:     Effort: Pulmonary effort is normal.   Musculoskeletal:        General: Swelling and tenderness present. Normal range of motion.       Arms:   Skin:    General: Skin is warm.     Capillary  Refill: Capillary refill takes less than 2 seconds.   Neurological:     Mental Status: She is oriented to person, place, and time.   Psychiatric:        Mood and Affect: Mood normal.        Behavior: Behavior normal.        Thought Content: Thought content normal.        Judgment: Judgment normal.     Assessment & Plan:  Hematoma  Fall, subsequent encounter  Laceration of left upper extremity, initial encounter  I recommend cleaning the area with Vashe at least once a day and then placing a Xeroform over it with Kerlix and an Ace wrap.  I would like to see her back in 2 weeks.  Pictures were obtained of the patient and placed in the chart with the patient's or guardian's permission.   Estefana RAMAN Georgann Bramble, DO

## 2024-06-26 ENCOUNTER — Other Ambulatory Visit: Payer: Self-pay | Admitting: Family Medicine

## 2024-06-27 ENCOUNTER — Ambulatory Visit (INDEPENDENT_AMBULATORY_CARE_PROVIDER_SITE_OTHER): Admitting: Surgical

## 2024-06-27 DIAGNOSIS — T148XXA Other injury of unspecified body region, initial encounter: Secondary | ICD-10-CM

## 2024-06-27 DIAGNOSIS — S41112A Laceration without foreign body of left upper arm, initial encounter: Secondary | ICD-10-CM

## 2024-06-27 DIAGNOSIS — W19XXXD Unspecified fall, subsequent encounter: Secondary | ICD-10-CM

## 2024-06-27 NOTE — Progress Notes (Signed)
   Referring Provider Myrla Jon HERO, MD [address] [place],  [place] [address]   CC: No chief complaint on file.    Savannah Tucker is an 89 y.o. female.  HPI: Patient is a 89 year old female here for follow-up on her left arm laceration.  She had a fall early June, was seen in the ER and had partial laceration repair.  She has been doing Vashe soaked gauze once a day and then placing Xeroform and Kerlix over the area.  She feels as if things are healing well and is very happy.  Review of Systems General: No fevers or chills  Physical Exam    06/12/2024   12:41 PM 06/05/2024    2:28 PM 05/26/2024    1:58 PM  Vitals with BMI  Height 5' 3 5' 3   Weight 104 lbs 3 oz 102 lbs   BMI 18.46 18.07   Systolic 133 126 893  Diastolic 76 76 58  Pulse 80 76 66    General:  No acute distress,  Alert and oriented, Non-Toxic, Normal speech and affect Left upper extremity: Left upper extremity wounds have completely healed and reepithelialized.  There is no erythema or cellulitic changes noted.  She has normal function of her left upper extremity with normal range of motion of all 5 digits and normal range of motion at the elbow and wrist joints.  No swelling or pitting edema is noted.  Palpable radial pulses noted.  Sensation intact.  Assessment/Plan 89 year old female with left upper extremity laceration/wounds after a fall.  The area has healed nicely and there is no residual wounds present.  Discussed no further wound care necessary, discussed with patient that she can keep it covered if she would like for a few more days, however not necessary if she feels comfortable keeping it uncovered.  All of her questions were answered to her content.  Recommend following up as needed, call with questions or concerns.  Savannah Tucker 06/27/2024, 1:15 PM

## 2024-07-18 DIAGNOSIS — H353211 Exudative age-related macular degeneration, right eye, with active choroidal neovascularization: Secondary | ICD-10-CM | POA: Diagnosis not present

## 2024-07-24 DIAGNOSIS — M461 Sacroiliitis, not elsewhere classified: Secondary | ICD-10-CM | POA: Diagnosis not present

## 2024-08-16 ENCOUNTER — Ambulatory Visit: Payer: Medicare Other | Admitting: Dermatology

## 2024-08-21 DIAGNOSIS — M48062 Spinal stenosis, lumbar region with neurogenic claudication: Secondary | ICD-10-CM | POA: Diagnosis not present

## 2024-08-21 DIAGNOSIS — M5416 Radiculopathy, lumbar region: Secondary | ICD-10-CM | POA: Diagnosis not present

## 2024-08-21 DIAGNOSIS — M461 Sacroiliitis, not elsewhere classified: Secondary | ICD-10-CM | POA: Diagnosis not present

## 2024-08-21 DIAGNOSIS — M47816 Spondylosis without myelopathy or radiculopathy, lumbar region: Secondary | ICD-10-CM | POA: Diagnosis not present

## 2024-09-04 DIAGNOSIS — M47816 Spondylosis without myelopathy or radiculopathy, lumbar region: Secondary | ICD-10-CM | POA: Diagnosis not present

## 2024-09-05 DIAGNOSIS — Z23 Encounter for immunization: Secondary | ICD-10-CM | POA: Diagnosis not present

## 2024-09-11 DIAGNOSIS — Z961 Presence of intraocular lens: Secondary | ICD-10-CM | POA: Diagnosis not present

## 2024-09-11 DIAGNOSIS — H401131 Primary open-angle glaucoma, bilateral, mild stage: Secondary | ICD-10-CM | POA: Diagnosis not present

## 2024-09-18 DIAGNOSIS — M47816 Spondylosis without myelopathy or radiculopathy, lumbar region: Secondary | ICD-10-CM | POA: Diagnosis not present

## 2024-09-20 DIAGNOSIS — I38 Endocarditis, valve unspecified: Secondary | ICD-10-CM | POA: Diagnosis not present

## 2024-09-20 DIAGNOSIS — Z950 Presence of cardiac pacemaker: Secondary | ICD-10-CM | POA: Diagnosis not present

## 2024-09-20 DIAGNOSIS — I10 Essential (primary) hypertension: Secondary | ICD-10-CM | POA: Diagnosis not present

## 2024-09-20 DIAGNOSIS — I495 Sick sinus syndrome: Secondary | ICD-10-CM | POA: Diagnosis not present

## 2024-09-20 DIAGNOSIS — I340 Nonrheumatic mitral (valve) insufficiency: Secondary | ICD-10-CM | POA: Diagnosis not present

## 2024-09-20 DIAGNOSIS — I482 Chronic atrial fibrillation, unspecified: Secondary | ICD-10-CM | POA: Diagnosis not present

## 2024-09-26 ENCOUNTER — Other Ambulatory Visit: Payer: Self-pay | Admitting: Family Medicine

## 2024-09-26 DIAGNOSIS — H353211 Exudative age-related macular degeneration, right eye, with active choroidal neovascularization: Secondary | ICD-10-CM | POA: Diagnosis not present

## 2024-10-10 DIAGNOSIS — M47816 Spondylosis without myelopathy or radiculopathy, lumbar region: Secondary | ICD-10-CM | POA: Diagnosis not present

## 2024-10-17 DIAGNOSIS — H353122 Nonexudative age-related macular degeneration, left eye, intermediate dry stage: Secondary | ICD-10-CM | POA: Diagnosis not present

## 2024-10-17 DIAGNOSIS — H401131 Primary open-angle glaucoma, bilateral, mild stage: Secondary | ICD-10-CM | POA: Diagnosis not present

## 2024-10-17 DIAGNOSIS — Z961 Presence of intraocular lens: Secondary | ICD-10-CM | POA: Diagnosis not present

## 2024-10-17 DIAGNOSIS — H353211 Exudative age-related macular degeneration, right eye, with active choroidal neovascularization: Secondary | ICD-10-CM | POA: Diagnosis not present

## 2024-10-17 DIAGNOSIS — Z23 Encounter for immunization: Secondary | ICD-10-CM | POA: Diagnosis not present

## 2024-11-21 DIAGNOSIS — M5416 Radiculopathy, lumbar region: Secondary | ICD-10-CM | POA: Diagnosis not present

## 2024-11-21 DIAGNOSIS — M47816 Spondylosis without myelopathy or radiculopathy, lumbar region: Secondary | ICD-10-CM | POA: Diagnosis not present

## 2024-11-21 DIAGNOSIS — M48062 Spinal stenosis, lumbar region with neurogenic claudication: Secondary | ICD-10-CM | POA: Diagnosis not present

## 2024-11-23 DIAGNOSIS — H353221 Exudative age-related macular degeneration, left eye, with active choroidal neovascularization: Secondary | ICD-10-CM | POA: Diagnosis not present

## 2024-12-22 ENCOUNTER — Other Ambulatory Visit: Payer: Self-pay | Admitting: Family Medicine

## 2025-05-15 ENCOUNTER — Ambulatory Visit: Admitting: Family Medicine
